# Patient Record
Sex: Male | Born: 1960 | Race: White | Hispanic: No | Smoking: Former smoker
Health system: Southern US, Community
[De-identification: ages and names within clinical notes are randomized; demographics above are authoritative.]

## PROBLEM LIST (undated history)

## (undated) DIAGNOSIS — I7090 Unspecified atherosclerosis: Secondary | ICD-10-CM

## (undated) HISTORY — PX: OTHER SURGICAL HISTORY: SHX169

## (undated) HISTORY — PX: ANKLE FUSION: SHX5718

---

## 1998-06-04 ENCOUNTER — Emergency Department (HOSPITAL_COMMUNITY): Admission: EM | Admit: 1998-06-04 | Discharge: 1998-06-04 | Payer: Self-pay | Admitting: *Deleted

## 1998-06-04 ENCOUNTER — Encounter: Payer: Self-pay | Admitting: Emergency Medicine

## 2007-07-13 ENCOUNTER — Emergency Department (HOSPITAL_COMMUNITY): Admission: EM | Admit: 2007-07-13 | Discharge: 2007-07-13 | Payer: Self-pay | Admitting: Emergency Medicine

## 2010-12-20 LAB — URINALYSIS, ROUTINE W REFLEX MICROSCOPIC
Bilirubin Urine: NEGATIVE
Glucose, UA: 100 — AB
Hgb urine dipstick: NEGATIVE
Ketones, ur: NEGATIVE
Nitrite: NEGATIVE
Protein, ur: NEGATIVE
Specific Gravity, Urine: 1.027
Urobilinogen, UA: 1
pH: 6.5

## 2013-02-28 ENCOUNTER — Other Ambulatory Visit: Payer: Self-pay | Admitting: Physical Medicine and Rehabilitation

## 2013-02-28 DIAGNOSIS — IMO0002 Reserved for concepts with insufficient information to code with codable children: Secondary | ICD-10-CM

## 2013-03-28 ENCOUNTER — Ambulatory Visit
Admission: RE | Admit: 2013-03-28 | Discharge: 2013-03-28 | Disposition: A | Discharge status: Home / Self Care | Payer: BC Managed Care – PPO | Source: Ambulatory Visit | Attending: Physical Medicine and Rehabilitation | Admitting: Physical Medicine and Rehabilitation

## 2013-03-28 DIAGNOSIS — IMO0002 Reserved for concepts with insufficient information to code with codable children: Secondary | ICD-10-CM

## 2013-03-31 ENCOUNTER — Other Ambulatory Visit (HOSPITAL_COMMUNITY): Payer: Self-pay | Admitting: Orthopedic Surgery

## 2013-03-31 DIAGNOSIS — M545 Low back pain, unspecified: Secondary | ICD-10-CM

## 2013-04-03 ENCOUNTER — Other Ambulatory Visit (HOSPITAL_COMMUNITY): Payer: Self-pay | Admitting: Orthopedic Surgery

## 2013-04-03 ENCOUNTER — Ambulatory Visit (HOSPITAL_COMMUNITY)
Admission: RE | Admit: 2013-04-03 | Discharge: 2013-04-03 | Disposition: A | Discharge status: Home / Self Care | Payer: BC Managed Care – PPO | Source: Ambulatory Visit | Attending: Orthopedic Surgery | Admitting: Orthopedic Surgery

## 2013-04-03 DIAGNOSIS — M545 Low back pain, unspecified: Secondary | ICD-10-CM

## 2013-04-03 DIAGNOSIS — M549 Dorsalgia, unspecified: Secondary | ICD-10-CM | POA: Insufficient documentation

## 2013-04-03 MED ORDER — TECHNETIUM TC 99M MEDRONATE IV KIT
25.0000 | PACK | Freq: Once | INTRAVENOUS | Status: AC | PRN
  Administered 2013-04-03: 25 via INTRAVENOUS

## 2013-07-16 ENCOUNTER — Other Ambulatory Visit: Payer: Self-pay | Admitting: Pain Medicine

## 2013-07-16 DIAGNOSIS — M545 Low back pain, unspecified: Secondary | ICD-10-CM

## 2013-07-16 DIAGNOSIS — M546 Pain in thoracic spine: Secondary | ICD-10-CM

## 2013-07-20 ENCOUNTER — Ambulatory Visit
Admission: RE | Admit: 2013-07-20 | Discharge: 2013-07-20 | Disposition: A | Discharge status: Home / Self Care | Payer: BC Managed Care – PPO | Source: Ambulatory Visit | Attending: Pain Medicine | Admitting: Pain Medicine

## 2013-07-20 DIAGNOSIS — M545 Low back pain, unspecified: Secondary | ICD-10-CM

## 2013-07-20 DIAGNOSIS — M546 Pain in thoracic spine: Secondary | ICD-10-CM

## 2013-11-25 ENCOUNTER — Other Ambulatory Visit: Payer: Self-pay | Admitting: Orthopedic Surgery

## 2013-11-25 DIAGNOSIS — M19079 Primary osteoarthritis, unspecified ankle and foot: Secondary | ICD-10-CM

## 2013-11-28 ENCOUNTER — Ambulatory Visit
Admission: RE | Admit: 2013-11-28 | Discharge: 2013-11-28 | Disposition: A | Discharge status: Home / Self Care | Payer: BC Managed Care – PPO | Source: Ambulatory Visit | Attending: Orthopedic Surgery | Admitting: Orthopedic Surgery

## 2013-11-28 DIAGNOSIS — M19079 Primary osteoarthritis, unspecified ankle and foot: Secondary | ICD-10-CM

## 2014-07-10 ENCOUNTER — Other Ambulatory Visit (HOSPITAL_COMMUNITY): Payer: Self-pay | Admitting: *Deleted

## 2014-07-13 ENCOUNTER — Encounter (HOSPITAL_COMMUNITY)
Admission: RE | Admit: 2014-07-13 | Discharge: 2014-07-13 | Disposition: A | Discharge status: Home / Self Care | Payer: BLUE CROSS/BLUE SHIELD | Source: Ambulatory Visit | Attending: Endocrinology | Admitting: Endocrinology

## 2014-07-13 DIAGNOSIS — Z87311 Personal history of (healed) other pathological fracture: Secondary | ICD-10-CM | POA: Diagnosis not present

## 2014-07-13 DIAGNOSIS — M810 Age-related osteoporosis without current pathological fracture: Secondary | ICD-10-CM | POA: Diagnosis present

## 2014-07-13 MED ORDER — DENOSUMAB 60 MG/ML ~~LOC~~ SOLN
60.0000 mg | Freq: Once | SUBCUTANEOUS | Status: AC
  Administered 2014-07-13: 60 mg via SUBCUTANEOUS
  Filled 2014-07-13: qty 1

## 2015-03-26 ENCOUNTER — Emergency Department (HOSPITAL_COMMUNITY): Payer: BLUE CROSS/BLUE SHIELD

## 2015-03-26 ENCOUNTER — Observation Stay (HOSPITAL_COMMUNITY)
Admission: EM | Admit: 2015-03-26 | Discharge: 2015-03-27 | Disposition: A | Discharge status: Home / Self Care | Payer: BLUE CROSS/BLUE SHIELD | Attending: Internal Medicine | Admitting: Internal Medicine

## 2015-03-26 ENCOUNTER — Observation Stay (HOSPITAL_COMMUNITY): Payer: BLUE CROSS/BLUE SHIELD

## 2015-03-26 ENCOUNTER — Encounter (HOSPITAL_COMMUNITY): Payer: Self-pay | Admitting: Emergency Medicine

## 2015-03-26 DIAGNOSIS — R7989 Other specified abnormal findings of blood chemistry: Secondary | ICD-10-CM | POA: Diagnosis not present

## 2015-03-26 DIAGNOSIS — Z8249 Family history of ischemic heart disease and other diseases of the circulatory system: Secondary | ICD-10-CM | POA: Diagnosis not present

## 2015-03-26 DIAGNOSIS — R55 Syncope and collapse: Principal | ICD-10-CM | POA: Diagnosis present

## 2015-03-26 DIAGNOSIS — R778 Other specified abnormalities of plasma proteins: Secondary | ICD-10-CM | POA: Diagnosis present

## 2015-03-26 DIAGNOSIS — F17290 Nicotine dependence, other tobacco product, uncomplicated: Secondary | ICD-10-CM | POA: Diagnosis not present

## 2015-03-26 DIAGNOSIS — R748 Abnormal levels of other serum enzymes: Secondary | ICD-10-CM | POA: Insufficient documentation

## 2015-03-26 DIAGNOSIS — D72829 Elevated white blood cell count, unspecified: Secondary | ICD-10-CM | POA: Insufficient documentation

## 2015-03-26 DIAGNOSIS — I480 Paroxysmal atrial fibrillation: Secondary | ICD-10-CM | POA: Diagnosis present

## 2015-03-26 DIAGNOSIS — R519 Headache, unspecified: Secondary | ICD-10-CM

## 2015-03-26 DIAGNOSIS — I4891 Unspecified atrial fibrillation: Secondary | ICD-10-CM

## 2015-03-26 DIAGNOSIS — R51 Headache: Secondary | ICD-10-CM | POA: Diagnosis not present

## 2015-03-26 HISTORY — DX: Unspecified atherosclerosis: I70.90

## 2015-03-26 LAB — CSF CELL COUNT WITH DIFFERENTIAL
EOS CSF: NONE SEEN % (ref 0–1)
Eosinophils, CSF: NONE SEEN % (ref 0–1)
RBC Count, CSF: 1 /mm3 — ABNORMAL HIGH
RBC Count, CSF: 2 /mm3 — ABNORMAL HIGH
Segmented Neutrophils-CSF: NONE SEEN % (ref 0–6)
Segmented Neutrophils-CSF: NONE SEEN % (ref 0–6)
TUBE #: 1
TUBE #: 4
WBC, CSF: 0 /mm3 (ref 0–5)
WBC, CSF: 2 /mm3 (ref 0–5)

## 2015-03-26 LAB — URINE MICROSCOPIC-ADD ON: RBC / HPF: NONE SEEN RBC/hpf (ref 0–5)

## 2015-03-26 LAB — URINALYSIS, ROUTINE W REFLEX MICROSCOPIC
BILIRUBIN URINE: NEGATIVE
GLUCOSE, UA: NEGATIVE mg/dL
HGB URINE DIPSTICK: NEGATIVE
KETONES UR: 15 mg/dL — AB
LEUKOCYTES UA: NEGATIVE
Nitrite: NEGATIVE
PROTEIN: 30 mg/dL — AB
Specific Gravity, Urine: 1.016 (ref 1.005–1.030)
pH: 6 (ref 5.0–8.0)

## 2015-03-26 LAB — TROPONIN I
Troponin I: 0.17 ng/mL — ABNORMAL HIGH (ref <0.031)
Troponin I: 0.12 ng/mL — ABNORMAL HIGH (ref <0.031)

## 2015-03-26 LAB — COMPREHENSIVE METABOLIC PANEL
ALT: 33 U/L (ref 17–63)
AST: 28 U/L (ref 15–41)
Albumin: 3.9 g/dL (ref 3.5–5.0)
Alkaline Phosphatase: 77 U/L (ref 38–126)
Anion gap: 9 (ref 5–15)
BUN: 8 mg/dL (ref 6–20)
CHLORIDE: 106 mmol/L (ref 101–111)
CO2: 26 mmol/L (ref 22–32)
CREATININE: 0.93 mg/dL (ref 0.61–1.24)
Calcium: 9.4 mg/dL (ref 8.9–10.3)
GFR calc Af Amer: >60 mL/min (ref >60)
Glucose, Bld: 143 mg/dL — ABNORMAL HIGH (ref 65–99)
Potassium: 3.8 mmol/L (ref 3.5–5.1)
Sodium: 141 mmol/L (ref 135–145)
Total Bilirubin: 0.9 mg/dL (ref 0.3–1.2)
Total Protein: 7.2 g/dL (ref 6.5–8.1)

## 2015-03-26 LAB — I-STAT CHEM 8, ED
BUN: 11 mg/dL (ref 6–20)
CHLORIDE: 104 mmol/L (ref 101–111)
CREATININE: 0.8 mg/dL (ref 0.61–1.24)
Calcium, Ion: 1.16 mmol/L (ref 1.12–1.23)
GLUCOSE: 136 mg/dL — AB (ref 65–99)
HCT: 46 % (ref 39.0–52.0)
Hemoglobin: 15.6 g/dL (ref 13.0–17.0)
POTASSIUM: 3.7 mmol/L (ref 3.5–5.1)
Sodium: 144 mmol/L (ref 135–145)
TCO2: 25 mmol/L (ref 0–100)

## 2015-03-26 LAB — I-STAT TROPONIN, ED: TROPONIN I, POC: 0.09 ng/mL — AB (ref 0.00–0.08)

## 2015-03-26 LAB — CBC
HEMATOCRIT: 42.4 % (ref 39.0–52.0)
HEMOGLOBIN: 14.5 g/dL (ref 13.0–17.0)
MCH: 32.7 pg (ref 26.0–34.0)
MCHC: 34.2 g/dL (ref 30.0–36.0)
MCV: 95.5 fL (ref 78.0–100.0)
Platelets: 285 10*3/uL (ref 150–400)
RBC: 4.44 MIL/uL (ref 4.22–5.81)
RDW: 13 % (ref 11.5–15.5)
WBC: 22.8 10*3/uL — AB (ref 4.0–10.5)

## 2015-03-26 LAB — PROTIME-INR
INR: 1.17 (ref 0.00–1.49)
Prothrombin Time: 15 seconds (ref 11.6–15.2)

## 2015-03-26 LAB — DIFFERENTIAL
BASOS ABS: 0 10*3/uL (ref 0.0–0.1)
BASOS PCT: 0 %
Eosinophils Absolute: 0.1 10*3/uL (ref 0.0–0.7)
Eosinophils Relative: 0 %
LYMPHS ABS: 1.7 10*3/uL (ref 0.7–4.0)
Lymphocytes Relative: 7 %
MONOS PCT: 5 %
Monocytes Absolute: 1.2 10*3/uL — ABNORMAL HIGH (ref 0.1–1.0)
NEUTROS ABS: 19.9 10*3/uL — AB (ref 1.7–7.7)
Neutrophils Relative %: 88 %

## 2015-03-26 LAB — TSH: TSH: 0.774 u[IU]/mL (ref 0.350–4.500)

## 2015-03-26 LAB — RAPID URINE DRUG SCREEN, HOSP PERFORMED
AMPHETAMINES: NOT DETECTED
BARBITURATES: NOT DETECTED
BENZODIAZEPINES: NOT DETECTED
COCAINE: NOT DETECTED
Opiates: NOT DETECTED
TETRAHYDROCANNABINOL: POSITIVE — AB

## 2015-03-26 LAB — D-DIMER, QUANTITATIVE: D-Dimer, Quant: 0.84 ug/mL-FEU — ABNORMAL HIGH (ref 0.00–0.50)

## 2015-03-26 LAB — PROTEIN AND GLUCOSE, CSF
Glucose, CSF: 80 mg/dL — ABNORMAL HIGH (ref 40–70)
TOTAL PROTEIN, CSF: 58 mg/dL — AB (ref 15–45)

## 2015-03-26 LAB — APTT: APTT: 26 s (ref 24–37)

## 2015-03-26 LAB — ETHANOL: Alcohol, Ethyl (B): <5 mg/dL (ref <5)

## 2015-03-26 MED ORDER — SODIUM CHLORIDE 0.9 % IV BOLUS (SEPSIS): 500.0000 mL | Freq: Once | INTRAVENOUS | Status: DC

## 2015-03-26 MED ORDER — SODIUM CHLORIDE 0.9 % IV SOLN: INTRAVENOUS | Status: DC

## 2015-03-26 MED ORDER — SODIUM CHLORIDE 0.9 % IV SOLN
Freq: Once | INTRAVENOUS | Status: AC
  Administered 2015-03-26: 17:00:00 via INTRAVENOUS

## 2015-03-26 MED ORDER — LIDOCAINE HCL 1 % IJ SOLN
15.0000 mL | Freq: Once | INTRAMUSCULAR | Status: AC
  Administered 2015-03-26: 15 mL

## 2015-03-26 MED ORDER — IOHEXOL 350 MG/ML SOLN
80.0000 mL | Freq: Once | INTRAVENOUS | Status: AC | PRN
  Administered 2015-03-26: 80 mL via INTRAVENOUS

## 2015-03-26 MED ORDER — SODIUM CHLORIDE 0.9 % IV BOLUS (SEPSIS)
1000.0000 mL | Freq: Once | INTRAVENOUS | Status: AC
  Administered 2015-03-26: 1000 mL via INTRAVENOUS

## 2015-03-26 MED ORDER — SODIUM CHLORIDE 0.9 % IV SOLN
INTRAVENOUS | Status: DC
  Administered 2015-03-26 – 2015-03-27 (×2): via INTRAVENOUS

## 2015-03-26 MED ORDER — ACETAMINOPHEN 325 MG PO TABS
650.0000 mg | ORAL_TABLET | Freq: Four times a day (QID) | ORAL | Status: DC | PRN
  Administered 2015-03-26 – 2015-03-27 (×2): 650 mg via ORAL
  Filled 2015-03-26 (×2): qty 2

## 2015-03-26 MED ORDER — SODIUM CHLORIDE 0.9 % IJ SOLN: 3.0000 mL | Freq: Two times a day (BID) | INTRAMUSCULAR | Status: DC

## 2015-03-26 MED ORDER — ASPIRIN 81 MG PO CHEW
324.0000 mg | CHEWABLE_TABLET | Freq: Once | ORAL | Status: AC
Start: 2015-03-26 — End: 2015-03-26
  Administered 2015-03-26: 324 mg via ORAL
  Filled 2015-03-26: qty 4

## 2015-03-26 NOTE — ED Provider Notes (Signed)
CSN: [id number]     Arrival date & time 03/26/15  1503 History   First MD Initiated Contact with Patient 03/26/15 1515     Chief Complaint  Patient presents with  . Loss of Consciousness  . Fall     (Consider location/radiation/quality/duration/timing/severity/associated sxs/prior Treatment) HPI Comments: Patient states he was working in a workshop when he had a sudden onset left-sided headache associated with nausea, dizziness and diaphoresis. He then had a syncopal episode where he got to a chair but then fell to the ground and woke up on the floor. He tried to get up but then passed out again striking the bridge of his nose on an object. He denies any chest pain or shortness of breath. He had one episode of vomiting for EMS. Patient reports no medical history and does not take any medications. He has a history of osteoporosis and arthritis. He states he did not have anything to eat today only coffee. Blood sugar was 207 for EMS. Monitor shows intermittent atrial fibrillation, bigeminy, SVT. No tongue biting or incontinence. Patient with no history of headache. No focal weakness, numbness or tingling.  Patient is a 54 y.o. male presenting with fall. The history is provided by the patient and the EMS personnel. The history is limited by the condition of the patient.  Fall Associated symptoms include headaches. Pertinent negatives include no shortness of breath.    Past Medical History  Diagnosis Date  . Atherosclerosis    Past Surgical History  Procedure Laterality Date  . Ankle fusion Right     titanium Plate   Family History  Problem Relation Age of Onset  . Coronary artery disease Father 7164    CABG   Social History  Substance Use Topics  . Smoking status: Current Some Day Smoker    Types: Cigars  . Smokeless tobacco: None  . Alcohol Use: 0.0 oz/week    0 Standard drinks or equivalent per week    Review of Systems  Constitutional: Negative for fever, activity change and  appetite change.  HENT: Negative for congestion.   Eyes: Negative for visual disturbance.  Respiratory: Negative for cough, chest tightness and shortness of breath.   Cardiovascular: Positive for syncope.  Gastrointestinal: Positive for nausea and vomiting.  Genitourinary: Negative for urgency and testicular pain.  Musculoskeletal: Positive for neck pain. Negative for myalgias and arthralgias.  Skin: Negative for wound.  Neurological: Positive for dizziness, light-headedness and headaches.  A complete 10 system review of systems was obtained and all systems are negative except as noted in the HPI and PMH.      Allergies  Codeine  Home Medications   Prior to Admission medications   Medication Sig Start Date End Date Taking? Authorizing Provider  Aspirin-Acetaminophen (GOODY BODY PAIN) 500-325 MG PACK Take 1 Package by mouth daily as needed (pain).   Yes Historical Provider, MD  aspirin-acetaminophen-caffeine (EXCEDRIN MIGRAINE) [phone] MG tablet Take 1 tablet by mouth every 6 (six) hours as needed for headache.   Yes Historical Provider, MD   BP 103/65 mmHg  Pulse 63  Temp(Src) 98 F (36.7 C) (Oral)  Resp 15  SpO2 97% Physical Exam  Constitutional: He is oriented to person, place, and time. He appears well-developed and well-nourished. No distress.  HENT:  Head: Normocephalic and atraumatic.  Mouth/Throat: Oropharynx is clear and moist. No oropharyngeal exudate.  Abrasion to bridge of nose, no septal hematoma or hemotympanum  Eyes: Conjunctivae and EOM are normal. Pupils are equal, round, and  reactive to light.  Neck: Normal range of motion. Neck supple.  Diffuse paraspinal C-spine tenderness  Cardiovascular: Normal rate, normal heart sounds and intact distal pulses.   No murmur heard. Irregular tachycardia, intermittent atrial fibrillation, bigeminy, SVT  Pulmonary/Chest: Effort normal and breath sounds normal. No respiratory distress.  Abdominal: Soft. There is no  tenderness. There is no rebound and no guarding.  Musculoskeletal: Normal range of motion. He exhibits no edema or tenderness.  Neurological: He is alert and oriented to person, place, and time. No cranial nerve deficit. He exhibits normal muscle tone. Coordination normal.  No ataxia on finger to nose bilaterally. No pronator drift. 5/5 strength throughout. CN 2-12 intact.Equal grip strength. Sensation intact.   Skin: Skin is warm.  Psychiatric: He has a normal mood and affect. His behavior is normal.  Nursing note and vitals reviewed.   ED Course  .Lumbar Puncture Date/Time: 03/26/2015 5:34 PM Performed by: Glynn Octave Authorized by: Glynn Octave Consent: Verbal consent obtained. Written consent obtained. Risks and benefits: risks, benefits and alternatives were discussed Consent given by: patient Patient understanding: patient states understanding of the procedure being performed Patient consent: the patient's understanding of the procedure matches consent given Procedure consent: procedure consent matches procedure scheduled Relevant documents: relevant documents present and verified Test results: test results available and properly labeled Site marked: the operative site was marked Imaging studies: imaging studies available Patient identity confirmed: provided demographic data and verbally with patient Time out: Immediately prior to procedure a "time out" was called to verify the correct patient, procedure, equipment, support staff and site/side marked as required. Indications: evaluation for subarachnoid hemorrhage Anesthesia: local infiltration Local anesthetic: lidocaine 1% without epinephrine Anesthetic total: 4 ml Patient sedated: no Preparation: Patient was prepped and draped in the usual sterile fashion. Lumbar space: L4-L5 interspace Patient's position: sitting Needle gauge: 20 Needle type: spinal needle - Quincke tip Needle length: 2.5 in Number of attempts:  2 Fluid appearance: clear Tubes of fluid: 4 Total volume: 4 ml Post-procedure: site cleaned and adhesive bandage applied Patient tolerance: Patient tolerated the procedure well with no immediate complications   (including critical care time) Labs Review Labs Reviewed  CBC - Abnormal; Notable for the following:    WBC 22.8 (*)    All other components within normal limits  DIFFERENTIAL - Abnormal; Notable for the following:    Neutro Abs 19.9 (*)    Monocytes Absolute 1.2 (*)    All other components within normal limits  COMPREHENSIVE METABOLIC PANEL - Abnormal; Notable for the following:    Glucose, Bld 143 (*)    All other components within normal limits  URINE RAPID DRUG SCREEN, HOSP PERFORMED - Abnormal; Notable for the following:    Tetrahydrocannabinol POSITIVE (*)    All other components within normal limits  URINALYSIS, ROUTINE W REFLEX MICROSCOPIC (NOT AT [facility]) - Abnormal; Notable for the following:    APPearance CLOUDY (*)    Ketones, ur 15 (*)    Protein, ur 30 (*)    All other components within normal limits  D-DIMER, QUANTITATIVE (NOT AT [facility]) - Abnormal; Notable for the following:    D-Dimer, Quant 0.84 (*)    All other components within normal limits  URINE MICROSCOPIC-ADD ON - Abnormal; Notable for the following:    Squamous Epithelial / LPF 0-5 (*)    Bacteria, UA RARE (*)    Casts GRANULAR CAST (*)    All other components within normal limits  I-STAT CHEM 8, ED -  Abnormal; Notable for the following:    Glucose, Bld 136 (*)    All other components within normal limits  I-STAT TROPOININ, ED - Abnormal; Notable for the following:    Troponin i, poc 0.09 (*)    All other components within normal limits  CSF CULTURE  ETHANOL  PROTIME-INR  APTT  CSF CELL COUNT WITH DIFFERENTIAL  CSF CELL COUNT WITH DIFFERENTIAL  PROTEIN AND GLUCOSE, CSF  TROPONIN I  TROPONIN I  TROPONIN I  TSH    Imaging Review Ct Head Wo Contrast  03/26/2015  CLINICAL DATA:   54 year old male with acute syncope today and left-sided headache and cervical spine pain. EXAM: CT HEAD WITHOUT CONTRAST CT CERVICAL SPINE WITHOUT CONTRAST TECHNIQUE: Multidetector CT imaging of the head and cervical spine was performed following the standard protocol without intravenous contrast. Multiplanar CT image reconstructions of the cervical spine were also generated. COMPARISON:  None. FINDINGS: CT HEAD FINDINGS No intracranial abnormalities are identified, including mass lesion or mass effect, hydrocephalus, extra-axial fluid collection, midline shift, hemorrhage, or acute infarction. The visualized bony calvarium is unremarkable. CT CERVICAL SPINE FINDINGS Normal alignment is noted. There is no evidence of acute fracture, subluxation or prevertebral soft tissue swelling. Mild -moderate degenerative disc disease and spondylosis at C5-6 and C6-7 noted. No focal bony lesions are identified. No soft tissue abnormalities are identified. Paraseptal emphysema in the lung apices noted. IMPRESSION: Unremarkable noncontrast head CT. No static evidence of acute injury to the cervical spine. Mild-moderate degenerative changes from C5-C7. Electronically Signed   By: Harmon Pier M.D.   On: 03/26/2015 16:00   Ct Cervical Spine Wo Contrast  03/26/2015  CLINICAL DATA:  54 year old male with acute syncope today and left-sided headache and cervical spine pain. EXAM: CT HEAD WITHOUT CONTRAST CT CERVICAL SPINE WITHOUT CONTRAST TECHNIQUE: Multidetector CT imaging of the head and cervical spine was performed following the standard protocol without intravenous contrast. Multiplanar CT image reconstructions of the cervical spine were also generated. COMPARISON:  None. FINDINGS: CT HEAD FINDINGS No intracranial abnormalities are identified, including mass lesion or mass effect, hydrocephalus, extra-axial fluid collection, midline shift, hemorrhage, or acute infarction. The visualized bony calvarium is unremarkable. CT  CERVICAL SPINE FINDINGS Normal alignment is noted. There is no evidence of acute fracture, subluxation or prevertebral soft tissue swelling. Mild -moderate degenerative disc disease and spondylosis at C5-6 and C6-7 noted. No focal bony lesions are identified. No soft tissue abnormalities are identified. Paraseptal emphysema in the lung apices noted. IMPRESSION: Unremarkable noncontrast head CT. No static evidence of acute injury to the cervical spine. Mild-moderate degenerative changes from C5-C7. Electronically Signed   By: Harmon Pier M.D.   On: 03/26/2015 16:00   Dg Chest Portable 1 View  03/26/2015  CLINICAL DATA:  Multiple syncopal episodes today.  Fell.  Vomiting. EXAM: PORTABLE CHEST 1 VIEW COMPARISON:  None. FINDINGS: Normal sized heart. Clear lungs. Diffuse peribronchial thickening. Old, healed right clavicle fracture. IMPRESSION: No acute abnormality.  Mild to moderate chronic bronchitic changes. Electronically Signed   By: Beckie Salts M.D.   On: 03/26/2015 16:08   I have personally reviewed and evaluated these images and lab results as part of my medical decision-making.   EKG Interpretation   Date/Time:  Friday March 26 2015 16:38:57 EST Ventricular Rate:  70 PR Interval:  147 QRS Duration: 116 QT Interval:  389 QTC Calculation: 420 R Axis:   91 Text Interpretation:  Sinus rhythm Incomplete right bundle branch block ST  elev, probable normal  early repol pattern now sinus Confirmed by Manus Gunning   MD, Pearlie Lafosse [phone]) on 03/26/2015 4:44:39 PM      MDM   Final diagnoses:  Syncope  Syncope  Syncope   Sudden onset headache with syncope, diaphoresis and vomiting. Irregular heart rhythms on monitor. High concern for subarachnoid hemorrhage.  Patient has no focal neurological deficits. CT head is negative for subarachnoid hemorrhage. CT C-spine is negative. Episode of A fib, then bigeminy, then SVT.  Improved with vagal maneuver.  Troponin positive for a 0.09. He denies any  chest pain or shortness of breath. Heart rhythm has seems to normalize to normal sinus rhythm. ASA given.  Case discussed with cardiology who will evaluate patient. Discussed with neurology Dr. Roseanne Reno as well. He agrees patient likely needs a lumbar puncture to exclude subarachnoid hemorrhage and ruptured aneurysm. However if he requires IV heparin or cardiac catheterization he will not be able to have this procedure. He recommends not performing lumbar puncture at this time until cardiology has evaluated patient.  Labs show positive troponin, leukocytosis. Drug screen positive for marijuana. Cardiology does not anticipate any invasive procedures.  They request D-dimer and medicine admit.  CT scan is negative for intracranial hemorrhage. Lumbar puncture performed as above.  D-dimer is slightly positive. Patient with no hypoxia or tachycardia. No chest pain or shortness of breath. Doubt pulmonary embolism but will check CT angiogram  CTA negative for PE> LP results pending at time of admission. D/w Dr. Elvera Lennox.  CRITICAL CARE Performed by: Glynn Octave Total critical care time: 35 minutes Critical care time was exclusive of separately billable procedures and treating other patients. Critical care was necessary to treat or prevent imminent or life-threatening deterioration. Critical care was time spent personally by me on the following activities: development of treatment plan with patient and/or surrogate as well as nursing, discussions with consultants, evaluation of patient's response to treatment, examination of patient, obtaining history from patient or surrogate, ordering and performing treatments and interventions, ordering and review of laboratory studies, ordering and review of radiographic studies, pulse oximetry and re-evaluation of patient's condition.   Glynn Octave, MD 03/26/15 [phone]

## 2015-03-26 NOTE — H&P (Addendum)
History and Physical   Wesley Barron ZOX:[id number] DOB: [birth year] DOA: 03/26/2015  Referring physician: Dr. Manus Gunning PCP: Julian Hy, MD  Specialists: cardiology, neurology  Chief Complaint: neurology   HPI: Wesley Barron is a 54 y.o. male has no past medical history, presents to the emergency room with a chief complaint of a syncopal episode. Patient was in his shop working, he was a cigar, when he had a syncopal episode and passed out. He also endorses a headache with nausea without. He got up and then he had another syncopal episode and he passed out again and hit his nose. EMS was called, and had an episode of emesis when EMS got there. He was brought to the emergency room where he went briefly to A. fib with RVR and an SVT with a heart rate of 140, he performed a vagal maneuver per EDP and he returned to sinus rhythm. He denies any chest pain or palpitations. He reports intermittent episodes of palpitations throughout the years, had one episode in the last year alone. In the emergency room, his blood work is pertinent for a white count of 22K, mild troponin elevation of 0.09 and a d-dimer elevation. Cardiology and neurology were consulted, there was initial concern for subarachnoid hemorrhage and patient underwent CT scan of the brain without acute findings and also on LP. LP results are pending. TRH was asked for admission.   Review of Systems: as per HPI otherwise negative   Past Medical History  Diagnosis Date  . Atherosclerosis    Past Surgical History  Procedure Laterality Date  . Ankle fusion Right     titanium Plate   Social History:  reports that he has been smoking Cigars.  He does not have any smokeless tobacco history on file. He reports that he drinks alcohol. His drug history is not on file.  Allergies  Allergen Reactions  . Codeine Other (See Comments)    headache    Family History  Problem Relation Age of Onset  . Coronary artery disease Father 59   CABG   Prior to Admission medications   Medication Sig Start Date End Date Taking? Authorizing Provider  Aspirin-Acetaminophen (GOODY BODY PAIN) 500-325 MG PACK Take 1 Package by mouth daily as needed (pain).   Yes Historical Provider, MD  aspirin-acetaminophen-caffeine (EXCEDRIN MIGRAINE) [phone] MG tablet Take 1 tablet by mouth every 6 (six) hours as needed for headache.   Yes Historical Provider, MD   Physical Exam: Filed Vitals:   03/26/15 1615 03/26/15 1630 03/26/15 1645 03/26/15 1715  BP: 115/69 102/73 120/70 103/65  Pulse: 70 69 68 63  Temp:      TempSrc:      Resp: SpO2: 96% 97% 95% 97%    GENERAL: NAD  HEENT: head NCAT, no scleral icterus. Pupils round and reactive.  NECK: Supple. No carotid bruits.   LUNGS: Clear to auscultation. No wheezing or crackles  HEART: Regular rate and rhythm without murmur. 2+ pulses, no JVD, no peripheral edema  ABDOMEN: Soft, nontender, and nondistended. Positive bowel sounds. No hepatosplenomegaly was noted.  EXTREMITIES: Without any cyanosis, clubbing, rash, lesions or edema.  NEUROLOGIC: Alert and oriented x3. Cranial nerves II through XII are grossly intact. Strength 5/5 in all 4.   Labs on Admission:  Basic Metabolic Panel:  Recent Labs Lab 03/26/15 1531 03/26/15 1544  NA 141 144  K 3.8 3.7  CL 106 104  CO2 26  --   GLUCOSE 143*  136*  BUN 8 11  CREATININE 0.93 0.80  CALCIUM 9.4  --    Liver Function Tests:  Recent Labs Lab 03/26/15 1531  AST 28  ALT 33  ALKPHOS 77  BILITOT 0.9  PROT 7.2  ALBUMIN 3.9   CBC:  Recent Labs Lab 03/26/15 1531 03/26/15 1544  WBC 22.8*  --   NEUTROABS 19.9*  --   HGB 14.5 15.6  HCT 42.4 46.0  MCV 95.5  --   PLT 285  --    Radiological Exams on Admission: Ct Head Wo Contrast  03/26/2015  CLINICAL DATA:  54 year old male with acute syncope today and left-sided headache and cervical spine pain. EXAM: CT HEAD WITHOUT CONTRAST CT CERVICAL SPINE WITHOUT  CONTRAST TECHNIQUE: Multidetector CT imaging of the head and cervical spine was performed following the standard protocol without intravenous contrast. Multiplanar CT image reconstructions of the cervical spine were also generated. COMPARISON:  None. FINDINGS: CT HEAD FINDINGS No intracranial abnormalities are identified, including mass lesion or mass effect, hydrocephalus, extra-axial fluid collection, midline shift, hemorrhage, or acute infarction. The visualized bony calvarium is unremarkable. CT CERVICAL SPINE FINDINGS Normal alignment is noted. There is no evidence of acute fracture, subluxation or prevertebral soft tissue swelling. Mild -moderate degenerative disc disease and spondylosis at C5-6 and C6-7 noted. No focal bony lesions are identified. No soft tissue abnormalities are identified. Paraseptal emphysema in the lung apices noted. IMPRESSION: Unremarkable noncontrast head CT. No static evidence of acute injury to the cervical spine. Mild-moderate degenerative changes from C5-C7. Electronically Signed   By: Harmon PierJeffrey  Hu M.D.   On: 03/26/2015 16:00   Ct Cervical Spine Wo Contrast  03/26/2015  CLINICAL DATA:  54 year old male with acute syncope today and left-sided headache and cervical spine pain. EXAM: CT HEAD WITHOUT CONTRAST CT CERVICAL SPINE WITHOUT CONTRAST TECHNIQUE: Multidetector CT imaging of the head and cervical spine was performed following the standard protocol without intravenous contrast. Multiplanar CT image reconstructions of the cervical spine were also generated. COMPARISON:  None. FINDINGS: CT HEAD FINDINGS No intracranial abnormalities are identified, including mass lesion or mass effect, hydrocephalus, extra-axial fluid collection, midline shift, hemorrhage, or acute infarction. The visualized bony calvarium is unremarkable. CT CERVICAL SPINE FINDINGS Normal alignment is noted. There is no evidence of acute fracture, subluxation or prevertebral soft tissue swelling. Mild -moderate  degenerative disc disease and spondylosis at C5-6 and C6-7 noted. No focal bony lesions are identified. No soft tissue abnormalities are identified. Paraseptal emphysema in the lung apices noted. IMPRESSION: Unremarkable noncontrast head CT. No static evidence of acute injury to the cervical spine. Mild-moderate degenerative changes from C5-C7. Electronically Signed   By: Harmon PierJeffrey  Hu M.D.   On: 03/26/2015 16:00   Dg Chest Portable 1 View  03/26/2015  CLINICAL DATA:  Multiple syncopal episodes today.  Fell.  Vomiting. EXAM: PORTABLE CHEST 1 VIEW COMPARISON:  None. FINDINGS: Normal sized heart. Clear lungs. Diffuse peribronchial thickening. Old, healed right clavicle fracture. IMPRESSION: No acute abnormality.  Mild to moderate chronic bronchitic changes. Electronically Signed   By: Beckie SaltsSteven  Reid M.D.   On: 03/26/2015 16:08    EKG: Independently reviewed. currently in sinus rhythm   Assessment/Plan Principal Problem:   Syncope and collapse Active Problems:   PAF (paroxysmal atrial fibrillation) (HCC)   Family history of coronary artery disease in father   Elevated troponin   Syncope   Syncopal episodes - broad differential, patient did have significant arrhythmia on admission, will admit to telemetry and monitored overnight  -  cardiology consulted, appreciate input  - Obtain a 2-D echo  - given elevated troponin we'll cycle cardiac enzymes overnight, however this is likely demand. He denies chest pain.  - Elevated d-dimer, CT angiogram of the chest is pending by EDP  Leukocytosis  - Likely reactive, he is afebrile, however the headache and the syncopal episode and LP has been done  - Results are pending, will follow up  - No apparent sources of infection  Paroxysmal A. fib  - CHADS VASC score is 0 - 2D echo   Diet: regular Fluids: NS DVT Prophylaxis: SCD  Code Status: Full  Family Communication: no family bedside  Disposition Plan: admit to tele    Schelly Chuba M. Elvera Lennox,  MD [facility] Pager [phone]  If 7PM-7AM, please contact night-coverage [email or web address] Password [email or web address] 03/26/2015, 5:58 PM

## 2015-03-26 NOTE — ED Notes (Addendum)
Per [facility], pt was outside working, had multiple syncopal episodes, sitting in the chair, passed out, woke up again stood up and passed out again. Pt hit bridge of nose, unsure on what. Pt not on any blood thinners. No medical hx. AAox4. Pt actively throwing up upon EMS arrival. Given 500ccs. CBG 207. BP 90 palpated, after fluid bolus 105/68. Denies pain at this time.

## 2015-03-26 NOTE — Progress Notes (Signed)
Patient arrived in the unit via bed accompanied by NT. Patient' stable. Orientation given to the unit. Patient verbalizes understanding.

## 2015-03-26 NOTE — ED Notes (Signed)
Pt transproted to CT with this nurse with no adverse events.

## 2015-03-26 NOTE — Consult Note (Signed)
Reason for Consult:   Syncope  Requesting Physician: ED Primary Cardiologist New  HPI:    54 y/o single male, one son 39 y/o, admitted via EMS to [facility] ED today after recurrent syncope and collapse. 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 He says he got up and collapsed again hitting his nose. He was transported to the ED by EMS. In the ED he was in AF with VR 110. He then had some ventricular ectopy and PSVT at 140. The ED MD had him perform a vagal maneuver and the pt converted to NSR. He denies any chest pain, hemoptysis, or SOB. He admits that he has noticed occasional palpitations at rest in the past. He no other significant medical issues and has never had prior cardiac evaluation.    PMHx:  Past Medical History  Diagnosis Date  . Atherosclerosis     Past Surgical History  Procedure Laterality Date  . Ankle fusion Right     titanium Plate    SOCHx:  reports that he has been smoking Cigars.  He does not have any smokeless tobacco history on file. He reports that he drinks alcohol. His drug history is not on file.  FAMHx: Family History  Problem Relation Age of Onset  . Coronary artery disease Father 1    CABG    ALLERGIES: Allergies  Allergen Reactions  . Codeine Other (See Comments)    headache    ROS: Review of Systems: Pt tells me he has been "laid up" with a bad back since October and just now starting to be up and around General: negative for chills, fever, night sweats or weight changes.  Cardiovascular: negative for chest pain, dyspnea on exertion, edema, orthopnea, palpitations, paroxysmal nocturnal dyspnea or shortness of breath HEENT: negative for any visual disturbances, blindness, glaucoma Dermatological: negative for rash Respiratory: negative for cough, hemoptysis, or wheezing Urologic: negative for hematuria or  dysuria Abdominal: negative for nausea, vomiting, diarrhea, bright red blood per rectum, melena, or hematemesis Neurologic: negative for visual changes, syncope, or dizziness Musculoskeletal: negative for back pain, joint pain, or swelling Psych: cooperative and appropriate All other systems reviewed and are otherwise negative except as noted above.   HOME MEDICATIONS: Prior to Admission medications   Medication Sig Start Date End Date Taking? Authorizing Provider  Aspirin-Acetaminophen (GOODY BODY PAIN) 500-325 MG PACK Take 1 Package by mouth daily as needed (pain).   Yes Historical Provider, MD  aspirin-acetaminophen-caffeine (EXCEDRIN MIGRAINE) 7243930520 MG tablet Take 1 tablet by mouth every 6 (six) hours as needed for headache.   Yes Historical Provider, MD    HOSPITAL MEDICATIONS: I have reviewed the patient's current medications.  VITALS: Blood pressure 110/67, pulse 67, temperature 98 F (36.7 C), temperature source Oral, resp. rate 12, SpO2 94 %.  PHYSICAL EXAM: General appearance: alert, cooperative, no distress and small laceration across the bridge of his nose Neck: no carotid bruit and no JVD Lungs: scattered rhonchi Heart: regular rate and rhythm Abdomen: soft, non-tender; bowel sounds normal; no masses,  no organomegaly Extremities: extremities normal, atraumatic, no cyanosis or edema Pulses: 2+ and symmetric Skin: Skin color, texture, turgor normal. No rashes or lesions Neurologic: Grossly normal  LABS: Results for orders placed or performed during the hospital encounter of 03/26/15 (from the past 24 hour(s))  Ethanol  Status: None   Collection Time: 03/26/15  3:31 PM  Result Value Ref Range   Alcohol, Ethyl (B) <5 <5 mg/dL  Protime-INR     Status: None   Collection Time: 03/26/15  3:31 PM  Result Value Ref Range   Prothrombin Time 15.0 11.6 - 15.2 seconds   INR 1.17 0.00 - 1.49  APTT     Status: None   Collection Time: 03/26/15  3:31 PM  Result Value  Ref Range   aPTT 26 24 - 37 seconds  CBC     Status: Abnormal   Collection Time: 03/26/15  3:31 PM  Result Value Ref Range   WBC 22.8 (H) 4.0 - 10.5 K/uL   RBC 4.44 4.22 - 5.81 MIL/uL   Hemoglobin 14.5 13.0 - 17.0 g/dL   HCT 69.6 29.5 - 28.4 %   MCV 95.5 78.0 - 100.0 fL   MCH 32.7 26.0 - 34.0 pg   MCHC 34.2 30.0 - 36.0 g/dL   RDW 13.2 44.0 - 10.2 %   Platelets 285 150 - 400 K/uL  Differential     Status: Abnormal   Collection Time: 03/26/15  3:31 PM  Result Value Ref Range   Neutrophils Relative % 88 %   Neutro Abs 19.9 (H) 1.7 - 7.7 K/uL   Lymphocytes Relative 7 %   Lymphs Abs 1.7 0.7 - 4.0 K/uL   Monocytes Relative 5 %   Monocytes Absolute 1.2 (H) 0.1 - 1.0 K/uL   Eosinophils Relative 0 %   Eosinophils Absolute 0.1 0.0 - 0.7 K/uL   Basophils Relative 0 %   Basophils Absolute 0.0 0.0 - 0.1 K/uL  Comprehensive metabolic panel     Status: Abnormal   Collection Time: 03/26/15  3:31 PM  Result Value Ref Range   Sodium 141 135 - 145 mmol/L   Potassium 3.8 3.5 - 5.1 mmol/L   Chloride 106 101 - 111 mmol/L   CO2 26 22 - 32 mmol/L   Glucose, Bld 143 (H) 65 - 99 mg/dL   BUN 8 6 - 20 mg/dL   Creatinine, Ser 7.25 0.61 - 1.24 mg/dL   Calcium 9.4 8.9 - 36.6 mg/dL   Total Protein 7.2 6.5 - 8.1 g/dL   Albumin 3.9 3.5 - 5.0 g/dL   AST 28 15 - 41 U/L   ALT 33 17 - 63 U/L   Alkaline Phosphatase 77 38 - 126 U/L   Total Bilirubin 0.9 0.3 - 1.2 mg/dL   GFR calc non Af Amer >60 >60 mL/min   GFR calc Af Amer >60 >60 mL/min   Anion gap 9 5 - 15  I-stat troponin, ED (not at [place], [facility])     Status: Abnormal   Collection Time: 03/26/15  3:38 PM  Result Value Ref Range   Troponin i, poc 0.09 (HH) 0.00 - 0.08 ng/mL   Comment NOTIFIED PHYSICIAN    Comment 3          I-Stat Chem 8, ED  (not at [facility], [facility])     Status: Abnormal   Collection Time: 03/26/15  3:44 PM  Result Value Ref Range   Sodium 144 135 - 145 mmol/L   Potassium 3.7 3.5 - 5.1 mmol/L   Chloride 104 101 - 111 mmol/L   BUN 11 6  - 20 mg/dL   Creatinine, Ser 4.40 0.61 - 1.24 mg/dL   Glucose, Bld 347 (H) 65 - 99 mg/dL   Calcium, Ion 4.25 9.56 - 1.23 mmol/L   TCO2  25 0 - 100 mmol/L   Hemoglobin 15.6 13.0 - 17.0 g/dL   HCT 16.1 09.6 - 04.5 %  Urinalysis, Routine w reflex microscopic (not at [facility])     Status: Abnormal   Collection Time: 03/26/15  4:10 PM  Result Value Ref Range   Color, Urine YELLOW YELLOW   APPearance CLOUDY (A) CLEAR   Specific Gravity, Urine 1.016 1.005 - 1.030   pH 6.0 5.0 - 8.0   Glucose, UA NEGATIVE NEGATIVE mg/dL   Hgb urine dipstick NEGATIVE NEGATIVE   Bilirubin Urine NEGATIVE NEGATIVE   Ketones, ur 15 (A) NEGATIVE mg/dL   Protein, ur 30 (A) NEGATIVE mg/dL   Nitrite NEGATIVE NEGATIVE   Leukocytes, UA NEGATIVE NEGATIVE  Urine microscopic-add on     Status: Abnormal   Collection Time: 03/26/15  4:10 PM  Result Value Ref Range   Squamous Epithelial / LPF 0-5 (A) NONE SEEN   WBC, UA 0-5 0 - 5 WBC/hpf   RBC / HPF NONE SEEN 0 - 5 RBC/hpf   Bacteria, UA RARE (A) NONE SEEN   Casts GRANULAR CAST (A) NEGATIVE    EKG: NSR, RBBB  IMAGING: Ct Head Wo Contrast  03/26/2015  CLINICAL DATA:  54 year old male with acute syncope today and left-sided headache and cervical spine pain. EXAM: CT HEAD WITHOUT CONTRAST CT CERVICAL SPINE WITHOUT CONTRAST TECHNIQUE: Multidetector CT imaging of the head and cervical spine was performed following the standard protocol without intravenous contrast. Multiplanar CT image reconstructions of the cervical spine were also generated. COMPARISON:  None. FINDINGS: CT HEAD FINDINGS No intracranial abnormalities are identified, including mass lesion or mass effect, hydrocephalus, extra-axial fluid collection, midline shift, hemorrhage, or acute infarction. The visualized bony calvarium is unremarkable. CT CERVICAL SPINE FINDINGS Normal alignment is noted. There is no evidence of acute fracture, subluxation or prevertebral soft tissue swelling. Mild -moderate degenerative disc  disease and spondylosis at C5-6 and C6-7 noted. No focal bony lesions are identified. No soft tissue abnormalities are identified. Paraseptal emphysema in the lung apices noted. IMPRESSION: Unremarkable noncontrast head CT. No static evidence of acute injury to the cervical spine. Mild-moderate degenerative changes from C5-C7. Electronically Signed   By: Harmon Pier M.D.   On: 03/26/2015 16:00   Ct Cervical Spine Wo Contrast  03/26/2015  CLINICAL DATA:  54 year old male with acute syncope today and left-sided headache and cervical spine pain. EXAM: CT HEAD WITHOUT CONTRAST CT CERVICAL SPINE WITHOUT CONTRAST TECHNIQUE: Multidetector CT imaging of the head and cervical spine was performed following the standard protocol without intravenous contrast. Multiplanar CT image reconstructions of the cervical spine were also generated. COMPARISON:  None. FINDINGS: CT HEAD FINDINGS No intracranial abnormalities are identified, including mass lesion or mass effect, hydrocephalus, extra-axial fluid collection, midline shift, hemorrhage, or acute infarction. The visualized bony calvarium is unremarkable. CT CERVICAL SPINE FINDINGS Normal alignment is noted. There is no evidence of acute fracture, subluxation or prevertebral soft tissue swelling. Mild -moderate degenerative disc disease and spondylosis at C5-6 and C6-7 noted. No focal bony lesions are identified. No soft tissue abnormalities are identified. Paraseptal emphysema in the lung apices noted. IMPRESSION: Unremarkable noncontrast head CT. No static evidence of acute injury to the cervical spine. Mild-moderate degenerative changes from C5-C7. Electronically Signed   By: Harmon Pier M.D.   On: 03/26/2015 16:00   Dg Chest Portable 1 View  03/26/2015  CLINICAL DATA:  Multiple syncopal episodes today.  Fell.  Vomiting. EXAM: PORTABLE CHEST 1 VIEW COMPARISON:  None. FINDINGS:  Normal sized heart. Clear lungs. Diffuse peribronchial thickening. Old, healed right clavicle  fracture. IMPRESSION: No acute abnormality.  Mild to moderate chronic bronchitic changes. Electronically Signed   By: Beckie SaltsSteven  Reid M.D.   On: 03/26/2015 16:08    IMPRESSION: Principal Problem:   Syncope and collapse Active Problems:   PAF (paroxysmal atrial fibrillation) (HCC)   Elevated troponin   Family history of coronary artery disease in father   RECOMMENDATION: Will review with MD. Check orthostatic B/P, cycle Troponin, check D Dimer, TSH, and echo. He will need a monitor after discharge. CHADs2VASc=0- no need to anticoagulate. Add low dose Lopressor. Hydrate.   Time Spent Directly with Patient: 40 minutes  Corine ShelterLuke Kilroy, [place]  [phone] beeper 03/26/2015, 4:56 PM   Attending Note:   The patient was seen and examined.  Agree with assessment and plan as noted above.  Changes made to the above note as needed.  1. Syncope: No previous episodes.   Has orthostasis on rare occasion . Symptoms could be related to orthostatis - he has not eaten all day .   If his echo is normal, I think the most likely scenerio is that he had  orthostasis    Will get an echo. Will monitor overnight. He will need a 30 day  Event monitor when he is discharged.  Other possiblities include arrhythmia. He had a brief episode of PAF and SVT but these were not associated with any recurrent syncope.  2. Atrial fib:   Had a brief run of paroxysmal atrial fib.   He had no post conversion pause when he converted to sinus rhythm.   His CHADS2VASC score is 0 . He does not need any additional meds at this point . Will get the echo. If he does not have any further PAF, he will not need long term follow up  3. Headache:  Further evaluation per Int. Med.  3. Leukocytosis:   Further eval by Int. Med.      Vesta MixerPhilip J. Nahser, Montez HagemanJr., MD, [facility] CenterFACC 03/26/2015, 5:23 PM [address],  [address] Office [phone] Pager [phone]

## 2015-03-27 ENCOUNTER — Observation Stay (HOSPITAL_BASED_OUTPATIENT_CLINIC_OR_DEPARTMENT_OTHER): Payer: BLUE CROSS/BLUE SHIELD

## 2015-03-27 ENCOUNTER — Other Ambulatory Visit: Payer: Self-pay | Admitting: Physician Assistant

## 2015-03-27 DIAGNOSIS — R55 Syncope and collapse: Secondary | ICD-10-CM

## 2015-03-27 DIAGNOSIS — R079 Chest pain, unspecified: Secondary | ICD-10-CM

## 2015-03-27 DIAGNOSIS — R51 Headache: Secondary | ICD-10-CM | POA: Diagnosis not present

## 2015-03-27 DIAGNOSIS — F17290 Nicotine dependence, other tobacco product, uncomplicated: Secondary | ICD-10-CM | POA: Diagnosis not present

## 2015-03-27 DIAGNOSIS — I480 Paroxysmal atrial fibrillation: Secondary | ICD-10-CM | POA: Diagnosis not present

## 2015-03-27 LAB — TROPONIN I: Troponin I: 0.07 ng/mL — ABNORMAL HIGH (ref <0.031)

## 2015-03-27 NOTE — Progress Notes (Signed)
Patient was discharged to home accompanied by his  father as per patient request. Discharge instructions, medication regimen and education given. Patient verbalized understanding. All personal belongings given. Telemetry box and IV removed prior to discharge.

## 2015-03-27 NOTE — Discharge Summary (Addendum)
Physician Discharge Summary  Wesley Barron D Barron ZOX:[id number] DOB: June 21, 1960 DOA: 03/26/2015  PCP: Julian HySOUTH,STEPHEN ALAN, MD  Admit date: 03/26/2015 Discharge date: 03/27/2015  Time spent: < 30 minutes  Recommendations for Outpatient Follow-up:  1. Follow up with Dr. Elease HashimotoNahser for outpatient stress test    Discharge Diagnoses:  Principal Problem:   Syncope and collapse Active Problems:   PAF (paroxysmal atrial fibrillation) (HCC)   Family history of coronary artery disease in father   Elevated troponin   Syncope  Discharge Condition: stable  Diet recommendation: regular  Filed Weights   03/27/15 0442  Weight: 91.218 kg (201 lb 1.6 oz)   History of present illness:  Wesley Barron is a 54 y.o. male has no past medical history, presents to the emergency room with a chief complaint of a syncopal episode. Patient was in his shop working, he was a cigar, when he had a syncopal episode and passed out. He also endorses a headache with nausea without. He got up and then he had another syncopal episode and he passed out again and hit his nose. EMS was called, and had an episode of emesis when EMS got there. He was brought to the emergency room where he went briefly to A. fib with RVR and an SVT with a heart rate of 140, he performed a vagal maneuver per EDP and he returned to sinus rhythm. He denies any chest pain or palpitations. He reports intermittent episodes of palpitations throughout the years, had one episode in the last year alone. In the emergency room, his blood work is pertinent for a white count of 22K, mild troponin elevation of 0.09 and a d-dimer elevation. Cardiology and neurology were consulted, there was initial concern for subarachnoid hemorrhage and patient underwent CT scan of the brain without acute findings and also on LP. LP results are pending. TRH was asked for admission.   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 fib with RVR and SVT which resolved on their own. Cardiology was consulted and they followed patient will hospitalized. He was monitored on telemetry without any significant events overnight. He underwent a 2-D echo which showed normal systolic and diastolic function with an EF of 55-60%. He had mildly elevated troponin which is likely demand, flat, and per cardiology will have a stress test as an outpatient. In the emergency room, he underwent a CT scan of the head without acute findings, as well as an LP given leukocytosis and headache which was unremarkable. His d-dimer was elevated on admission, had a CT angiogram of his chest which was negative for pulmonary embolus. He was discharged home in stable condition with close outpatient follow-up with cardiology.  Procedures:  2D echo   Consultations:  Cardiology   Discharge Exam: Filed Vitals:   03/27/15 0001 03/27/15 0004 03/27/15 0442 03/27/15 1200  BP: 99/57 99/55 107/57 112/59  Pulse: 58 55 51 46  Temp:   98.6 F (37 C) 98.7 F (37.1 C)  TempSrc:   Oral Oral  Resp:   18 20  Height:      Weight:   91.218 kg (201 lb 1.6 oz)   SpO2:   97% 98%    General: NAD Cardiovascular: RRR Respiratory: CTA biL  Discharge Instructions Activity:  As tolerated   Get Medicines reviewed and adjusted: Please take all your medications with you for your next  visit with your Primary MD  Please request your Primary MD to go over all hospital tests and procedure/radiological results at the follow up, please ask your Primary MD to get all Hospital records sent to his/her office.  If you experience worsening of your admission symptoms, develop shortness of breath, life threatening emergency, suicidal or homicidal thoughts you must seek medical attention immediately by calling 911 or calling your MD immediately if symptoms less severe.  You must read complete instructions/literature  along with all the possible adverse reactions/side effects for all the Medicines you take and that have been prescribed to you. Take any new Medicines after you have completely understood and accpet all the possible adverse reactions/side effects.   Do not drive when taking Pain medications.   Do not take more than prescribed Pain, Sleep and Anxiety Medications  Special Instructions: If you have smoked or chewed Tobacco in the last 2 yrs please stop smoking, stop any regular Alcohol and or any Recreational drug use.  Wear Seat belts while driving.  Please note  You were cared for by a hospitalist during your hospital stay. Once you are discharged, your primary care physician will handle any further medical issues. Please note that NO REFILLS for any discharge medications will be authorized once you are discharged, as it is imperative that you return to your primary care physician (or establish a relationship with a primary care physician if you do not have one) for your aftercare needs so that they can reassess your need for medications and monitor your lab values.    Medication List    TAKE these medications        aspirin-acetaminophen-caffeine 250-250-65 MG tablet  Commonly known as:  EXCEDRIN MIGRAINE  Take 1 tablet by mouth every 6 (six) hours as needed for headache.     GOODY BODY PAIN 500-325 MG Pack  Generic drug:  Aspirin-Acetaminophen  Take 1 Package by mouth daily as needed (pain).           Follow-up Information    Follow up with Nahser, Deloris Ping, MD.   Specialty:  Cardiology   Why:  The office will call you to make an appoinment for a stress test and follow up. , If you do not hear from them, please contact them., You should be seen within 1-2 weeks.   Contact information:   [address] [place] [facility] [place] [address] [phone]       The results of significant diagnostics from this hospitalization (including imaging, microbiology, ancillary and  laboratory) are listed below for reference.    Significant Diagnostic Studies: Ct Head Wo Contrast  03/26/2015  CLINICAL DATA:  54 year old male with acute syncope today and left-sided headache and cervical spine pain. EXAM: CT HEAD WITHOUT CONTRAST CT CERVICAL SPINE WITHOUT CONTRAST TECHNIQUE: Multidetector CT imaging of the head and cervical spine was performed following the standard protocol without intravenous contrast. Multiplanar CT image reconstructions of the cervical spine were also generated. COMPARISON:  None. FINDINGS: CT HEAD FINDINGS No intracranial abnormalities are identified, including mass lesion or mass effect, hydrocephalus, extra-axial fluid collection, midline shift, hemorrhage, or acute infarction. The visualized bony calvarium is unremarkable. CT CERVICAL SPINE FINDINGS Normal alignment is noted. There is no evidence of acute fracture, subluxation or prevertebral soft tissue swelling. Mild -moderate degenerative disc disease and spondylosis at C5-6 and C6-7 noted. No focal bony lesions are identified. No soft tissue abnormalities are identified. Paraseptal emphysema in the lung apices noted. IMPRESSION: Unremarkable noncontrast  head CT. No static evidence of acute injury to the cervical spine. Mild-moderate degenerative changes from C5-C7. Electronically Signed   By: Harmon Pier M.D.   On: 03/26/2015 16:00   Ct Angio Chest Pe W/cm &/or Wo Cm  03/26/2015  CLINICAL DATA:  54 year old male with multiple syncopal episodes while working outside. EXAM: CT ANGIOGRAPHY CHEST WITH CONTRAST TECHNIQUE: Multidetector CT imaging of the chest was performed using the standard protocol during bolus administration of intravenous contrast. Multiplanar CT image reconstructions and MIPs were obtained to evaluate the vascular anatomy. CONTRAST:  80mL OMNIPAQUE IOHEXOL 350 MG/ML SOLN COMPARISON:  No priors. FINDINGS: Mediastinum/Lymph Nodes: There are no filling defects within the pulmonary arterial tree  to suggest underlying pulmonary embolism. Heart size is normal. There is no significant pericardial fluid, thickening or pericardial calcification. No pathologically enlarged mediastinal or hilar lymph nodes. Esophagus is unremarkable in appearance. No axillary lymphadenopathy. Lungs/Pleura: There is some dependent areas of ground-glass attenuation which are favored to largely reflect some mild dependent subsegmental atelectasis. Mild centrilobular and paraseptal emphysema. No acute consolidative airspace disease. No pleural effusions. No suspicious appearing pulmonary nodules or masses. Upper Abdomen: Unremarkable. Musculoskeletal/Soft Tissues: Multiple chronic appearing compression fractures at T6, T7 and T8-11, with approximately 40% loss of central vertebral body height at the level of T11. There are no aggressive appearing lytic or blastic lesions noted in the visualized portions of the skeleton. Review of the MIP images confirms the above findings. IMPRESSION: 1. No evidence of pulmonary embolism. 2. No acute findings in the thorax to account for the patient's symptoms. 3. Mild diffuse bronchial wall thickening with mild centrilobular and paraseptal emphysema; imaging findings suggestive of underlying COPD. 4. Additional incidental findings, as above. Electronically Signed   By: Trudie Reed M.D.   On: 03/26/2015 18:44   Ct Cervical Spine Wo Contrast  03/26/2015  CLINICAL DATA:  54 year old male with acute syncope today and left-sided headache and cervical spine pain. EXAM: CT HEAD WITHOUT CONTRAST CT CERVICAL SPINE WITHOUT CONTRAST TECHNIQUE: Multidetector CT imaging of the head and cervical spine was performed following the standard protocol without intravenous contrast. Multiplanar CT image reconstructions of the cervical spine were also generated. COMPARISON:  None. FINDINGS: CT HEAD FINDINGS No intracranial abnormalities are identified, including mass lesion or mass effect, hydrocephalus,  extra-axial fluid collection, midline shift, hemorrhage, or acute infarction. The visualized bony calvarium is unremarkable. CT CERVICAL SPINE FINDINGS Normal alignment is noted. There is no evidence of acute fracture, subluxation or prevertebral soft tissue swelling. Mild -moderate degenerative disc disease and spondylosis at C5-6 and C6-7 noted. No focal bony lesions are identified. No soft tissue abnormalities are identified. Paraseptal emphysema in the lung apices noted. IMPRESSION: Unremarkable noncontrast head CT. No static evidence of acute injury to the cervical spine. Mild-moderate degenerative changes from C5-C7. Electronically Signed   By: Harmon Pier M.D.   On: 03/26/2015 16:00   Dg Chest Portable 1 View  03/26/2015  CLINICAL DATA:  Multiple syncopal episodes today.  Fell.  Vomiting. EXAM: PORTABLE CHEST 1 VIEW COMPARISON:  None. FINDINGS: Normal sized heart. Clear lungs. Diffuse peribronchial thickening. Old, healed right clavicle fracture. IMPRESSION: No acute abnormality.  Mild to moderate chronic bronchitic changes. Electronically Signed   By: Beckie Salts M.D.   On: 03/26/2015 16:08    Microbiology: Recent Results (from the past 240 hour(s))  CSF culture with Stat gram stain     Status: None (Preliminary result)   Collection Time: 03/26/15  5:29 PM  Result Value  Ref Range Status   Specimen Description CSF  Final   Special Requests NONE  Final   Gram Stain   Final    WBC PRESENT, PREDOMINANTLY MONONUCLEAR NO ORGANISMS SEEN CYTOSPIN    Culture NO GROWTH < 12 HOURS  Final   Report Status PENDING  Incomplete     Labs: Basic Metabolic Panel:  Recent Labs Lab 03/26/15 1531 03/26/15 1544  NA 141 144  K 3.8 3.7  CL 106 104  CO2 26  --   GLUCOSE 143* 136*  BUN 8 11  CREATININE 0.93 0.80  CALCIUM 9.4  --    Liver Function Tests:  Recent Labs Lab 03/26/15 1531  AST 28  ALT 33  ALKPHOS 77  BILITOT 0.9  PROT 7.2  ALBUMIN 3.9   CBC:  Recent Labs Lab  03/26/15 1531 03/26/15 1544  WBC 22.8*  --   NEUTROABS 19.9*  --   HGB 14.5 15.6  HCT 42.4 46.0  MCV 95.5  --   PLT 285  --    Cardiac Enzymes:  Recent Labs Lab 03/26/15 1736 03/26/15 2304 03/27/15 0503  TROPONINI 0.17* 0.12* 0.07*      Signed:  Pamella Pert  [facility] 03/27/2015, 1:54 PM

## 2015-03-27 NOTE — Progress Notes (Signed)
  Echocardiogram 2D Echocardiogram has been performed.  Delcie RochENNINGTON, Cian Costanzo 03/27/2015, 11:24 AM

## 2015-03-27 NOTE — Progress Notes (Signed)
The patient's HR was falling briefly to 39 when he was asleep this morning.  His VS have been stable and his HR was in the 40s-50s last night.  Dr. Elvera LennoxGherghe was text paged and the issue was reported off to the day shift nurse.

## 2015-03-27 NOTE — Progress Notes (Signed)
SUBJECTIVE:  Still complaining of headache.     PHYSICAL EXAM Filed Vitals:   03/26/15 2358 03/27/15 0001 03/27/15 0004 03/27/15 0442  BP: 107/57  Pulse: 54 58 55 51  Temp: 97.6 F (36.4 C)   98.6 F (37 C)  TempSrc: Oral   Oral  Resp: 18   18  Height:      Weight:    201 lb 1.6 oz (91.218 kg)  SpO2: 97%   97%   General:  No distress Lungs:  Clear Heart:  RRR Abdomen:  Positive bowel sounds, no rebound no guarding Extremities:  No edema  LABS: Lab Results  Component Value Date   TROPONINI 0.07* 03/27/2015   Results for orders placed or performed during the hospital encounter of 03/26/15 (from the past 24 hour(s))  Ethanol     Status: None   Collection Time: 03/26/15  3:31 PM  Result Value Ref Range   Alcohol, Ethyl (B) <5 <5 mg/dL  Protime-INR     Status: None   Collection Time: 03/26/15  3:31 PM  Result Value Ref Range   Prothrombin Time 15.0 11.6 - 15.2 seconds   INR 1.17 0.00 - 1.49  APTT     Status: None   Collection Time: 03/26/15  3:31 PM  Result Value Ref Range   aPTT 26 24 - 37 seconds  CBC     Status: Abnormal   Collection Time: 03/26/15  3:31 PM  Result Value Ref Range   WBC 22.8 (H) 4.0 - 10.5 K/uL   RBC 4.44 4.22 - 5.81 MIL/uL   Hemoglobin 14.5 13.0 - 17.0 g/dL   HCT 16.1 09.6 - 04.5 %   MCV 95.5 78.0 - 100.0 fL   MCH 32.7 26.0 - 34.0 pg   MCHC 34.2 30.0 - 36.0 g/dL   RDW 40.9 81.1 - 91.4 %   Platelets 285 150 - 400 K/uL  Differential     Status: Abnormal   Collection Time: 03/26/15  3:31 PM  Result Value Ref Range   Neutrophils Relative % 88 %   Neutro Abs 19.9 (H) 1.7 - 7.7 K/uL   Lymphocytes Relative 7 %   Lymphs Abs 1.7 0.7 - 4.0 K/uL   Monocytes Relative 5 %   Monocytes Absolute 1.2 (H) 0.1 - 1.0 K/uL   Eosinophils Relative 0 %   Eosinophils Absolute 0.1 0.0 - 0.7 K/uL   Basophils Relative 0 %   Basophils Absolute 0.0 0.0 - 0.1 K/uL  Comprehensive metabolic panel     Status: Abnormal   Collection Time:  03/26/15  3:31 PM  Result Value Ref Range   Sodium 141 135 - 145 mmol/L   Potassium 3.8 3.5 - 5.1 mmol/L   Chloride 106 101 - 111 mmol/L   CO2 26 22 - 32 mmol/L   Glucose, Bld 143 (H) 65 - 99 mg/dL   BUN 8 6 - 20 mg/dL   Creatinine, Ser 7.82 0.61 - 1.24 mg/dL   Calcium 9.4 8.9 - 95.6 mg/dL   Total Protein 7.2 6.5 - 8.1 g/dL   Albumin 3.9 3.5 - 5.0 g/dL   AST 28 15 - 41 U/L   ALT 33 17 - 63 U/L   Alkaline Phosphatase 77 38 - 126 U/L   Total Bilirubin 0.9 0.3 - 1.2 mg/dL   GFR calc non Af Amer >60 >60 mL/min   GFR calc Af Amer >60 >60 mL/min   Anion gap 9 5 - 15  D-dimer, quantitative (not at [facility])     Status: Abnormal   Collection Time: 03/26/15  3:31 PM  Result Value Ref Range   D-Dimer, Quant 0.84 (H) 0.00 - 0.50 ug/mL-FEU  I-stat troponin, ED (not at [facility])     Status: Abnormal   Collection Time: 03/26/15  3:38 PM  Result Value Ref Range   Troponin i, poc 0.09 (HH) 0.00 - 0.08 ng/mL   Comment NOTIFIED PHYSICIAN    Comment 3          I-Stat Chem 8, ED  (not at [facility], [facility])     Status: Abnormal   Collection Time: 03/26/15  3:44 PM  Result Value Ref Range   Sodium 144 135 - 145 mmol/L   Potassium 3.7 3.5 - 5.1 mmol/L   Chloride 104 101 - 111 mmol/L   BUN 11 6 - 20 mg/dL   Creatinine, Ser 2.530.80 0.61 - 1.24 mg/dL   Glucose, Bld 664136 (H) 65 - 99 mg/dL   Calcium, Ion 4.031.16 4.741.12 - 1.23 mmol/L   TCO2 25 0 - 100 mmol/L   Hemoglobin 15.6 13.0 - 17.0 g/dL   HCT 25.946.0 56.339.0 - 87.552.0 %  Urine rapid drug screen (hosp performed)not at [facility]     Status: Abnormal   Collection Time: 03/26/15  4:10 PM  Result Value Ref Range   Opiates NONE DETECTED NONE DETECTED   Cocaine NONE DETECTED NONE DETECTED   Benzodiazepines NONE DETECTED NONE DETECTED   Amphetamines NONE DETECTED NONE DETECTED   Tetrahydrocannabinol POSITIVE (A) NONE DETECTED   Barbiturates NONE DETECTED NONE DETECTED  Urinalysis, Routine w reflex microscopic (not at [facility])     Status: Abnormal   Collection Time: 03/26/15  4:10  PM  Result Value Ref Range   Color, Urine YELLOW YELLOW   APPearance CLOUDY (A) CLEAR   Specific Gravity, Urine 1.016 1.005 - 1.030   pH 6.0 5.0 - 8.0   Glucose, UA NEGATIVE NEGATIVE mg/dL   Hgb urine dipstick NEGATIVE NEGATIVE   Bilirubin Urine NEGATIVE NEGATIVE   Ketones, ur 15 (A) NEGATIVE mg/dL   Protein, ur 30 (A) NEGATIVE mg/dL   Nitrite NEGATIVE NEGATIVE   Leukocytes, UA NEGATIVE NEGATIVE  Urine microscopic-add on     Status: Abnormal   Collection Time: 03/26/15  4:10 PM  Result Value Ref Range   Squamous Epithelial / LPF 0-5 (A) NONE SEEN   WBC, UA 0-5 0 - 5 WBC/hpf   RBC / HPF NONE SEEN 0 - 5 RBC/hpf   Bacteria, UA RARE (A) NONE SEEN   Casts GRANULAR CAST (A) NEGATIVE  CSF cell count with differential collection tube #: 1     Status: Abnormal   Collection Time: 03/26/15  5:29 PM  Result Value Ref Range   Tube # 1    Color, CSF COLORLESS COLORLESS   Appearance, CSF CLEAR CLEAR   Supernatant NOT INDICATED    RBC Count, CSF 1 (H) 0 /cu mm   WBC, CSF 2 0 - 5 /cu mm   Segmented Neutrophils-CSF NONE SEEN 0 - 6 %   Lymphs, CSF FEW 40 - 80 %   Monocyte-Macrophage-Spinal Fluid RARE 15 - 45 %   Eosinophils, CSF NONE SEEN 0 - 1 %  CSF cell count with differential collection tube #: 4     Status: Abnormal   Collection Time: 03/26/15  5:29 PM  Result Value Ref Range   Tube # 4    Color, CSF COLORLESS COLORLESS   Appearance,  CSF CLEAR CLEAR   Supernatant NOT INDICATED    RBC Count, CSF 2 (H) 0 /cu mm   WBC, CSF 0 0 - 5 /cu mm   Segmented Neutrophils-CSF NONE SEEN 0 - 6 %   Lymphs, CSF FEW 40 - 80 %   Monocyte-Macrophage-Spinal Fluid RARE 15 - 45 %   Eosinophils, CSF NONE SEEN 0 - 1 %  CSF culture with Stat gram stain     Status: None (Preliminary result)   Collection Time: 03/26/15  5:29 PM  Result Value Ref Range   Specimen Description CSF    Special Requests NONE    Gram Stain      WBC PRESENT, PREDOMINANTLY MONONUCLEAR NO ORGANISMS SEEN CYTOSPIN    Culture NO  GROWTH < 12 HOURS    Report Status PENDING   Protein and glucose, CSF     Status: Abnormal   Collection Time: 03/26/15  5:34 PM  Result Value Ref Range   Glucose, CSF 80 (H) 40 - 70 mg/dL   Total  Protein, CSF 58 (H) 15 - 45 mg/dL  Troponin I     Status: Abnormal   Collection Time: 03/26/15  5:36 PM  Result Value Ref Range   Troponin I 0.17 (H) <0.031 ng/mL  TSH     Status: None   Collection Time: 03/26/15  5:36 PM  Result Value Ref Range   TSH 0.774 0.350 - 4.500 uIU/mL  Troponin I     Status: Abnormal   Collection Time: 03/26/15 11:04 PM  Result Value Ref Range   Troponin I 0.12 (H) <0.031 ng/mL  Troponin I     Status: Abnormal   Collection Time: 03/27/15  5:03 AM  Result Value Ref Range   Troponin I 0.07 (H) <0.031 ng/mL    Intake/Output Summary (Last 24 hours) at 03/27/15 1126 Last data filed at 03/27/15 0936  Gross per 24 hour  Intake   1900 ml  Output    900 ml  Net   1000 ml    EKG:    NSR, rate 46, no acute ST T wave changes.  03/27/2015  ASSESSMENT AND PLAN:  PAF:  No further episodes.  Mr. Wesley Barron has a CHA2DS2 - VASc score of 0.    Echo is done but not resulted yet.    SYNCOPE:   Work up per primary team.    ELEVATED TROPONIN:   Trended down.  I will ask our staff to schedule an out patient Lexiscan Myoview.  Wesley Barron [facility] 03/27/2015 11:26 AM

## 2015-03-27 NOTE — Discharge Instructions (Signed)
Follow with Wesley Barron,Wesley ALAN, MD in 1-2 months  Please get a complete blood count and chemistry panel checked by your Primary MD at your next visit, and again as instructed by your Primary MD. Please get your medications reviewed and adjusted by your Primary MD.  Please request your Primary MD to go over all Hospital Tests and Procedure/Radiological results at the follow up, please get all Hospital records sent to your Prim MD by signing hospital release before you go home.  If you had Pneumonia of Lung problems at the Hospital: Please get a 2 view Chest X Finlee done in 6-8 weeks after hospital discharge or sooner if instructed by your Primary MD.  If you have Congestive Heart Failure: Please call your Cardiologist or Primary MD anytime you have any of the following symptoms:  1) 3 pound weight gain in 24 hours or 5 pounds in 1 week  2) shortness of breath, with or without a dry hacking cough  3) swelling in the hands, feet or stomach  4) if you have to sleep on extra pillows at night in order to breathe  Follow cardiac low salt diet and 1.5 lit/day fluid restriction.  If you have diabetes Accuchecks 4 times/day, Once in AM empty stomach and then before each meal. Log in all results and show them to your primary doctor at your next visit. If any glucose reading is under 80 or above 300 call your primary MD immediately.  If you have Seizure/Convulsions/Epilepsy: Please do not drive, operate heavy machinery, participate in activities at heights or participate in high speed sports until you have seen by Primary MD or a Neurologist and advised to do so again.  If you had Gastrointestinal Bleeding: Please ask your Primary MD to check a complete blood count within one week of discharge or at your next visit. Your endoscopic/colonoscopic biopsies that are pending at the time of discharge, will also need to followed by your Primary MD.  Get Medicines reviewed and adjusted. Please take all your  medications with you for your next visit with your Primary MD  Please request your Primary MD to go over all hospital tests and procedure/radiological results at the follow up, please ask your Primary MD to get all Hospital records sent to his/her office.  If you experience worsening of your admission symptoms, develop shortness of breath, life threatening emergency, suicidal or homicidal thoughts you must seek medical attention immediately by calling 911 or calling your MD immediately  if symptoms less severe.  You must read complete instructions/literature along with all the possible adverse reactions/side effects for all the Medicines you take and that have been prescribed to you. Take any new Medicines after you have completely understood and accpet all the possible adverse reactions/side effects.   Do not drive or operate heavy machinery when taking Pain medications.   Do not take more than prescribed Pain, Sleep and Anxiety Medications  Special Instructions: If you have smoked or chewed Tobacco  in the last 2 yrs please stop smoking, stop any regular Alcohol  and or any Recreational drug use.  Wear Seat belts while driving.  Please note You were cared for by a hospitalist during your hospital stay. If you have any questions about your discharge medications or the care you received while you were in the hospital after you are discharged, you can call the unit and asked to speak with the hospitalist on call if the hospitalist that took care of you is not available. Once  you are discharged, your primary care physician will handle any further medical issues. Please note that NO REFILLS for any discharge medications will be authorized once you are discharged, as it is imperative that you return to your primary care physician (or establish a relationship with a primary care physician if you do not have one) for your aftercare needs so that they can reassess your need for medications and monitor your  lab values.  You can reach the hospitalist office at phone [phone] or fax [phone]   If you do not have a primary care physician, you can call [phone] for a physician referral.  Activity: As tolerated with Full fall precautions use walker/cane & assistance as needed  Diet: regular  Disposition Home

## 2015-03-30 LAB — CSF CULTURE: CULTURE: NO GROWTH

## 2015-03-30 LAB — CSF CULTURE W GRAM STAIN

## 2015-04-08 ENCOUNTER — Telehealth (HOSPITAL_COMMUNITY): Payer: Self-pay

## 2015-04-08 NOTE — Telephone Encounter (Signed)
Encounter complete. 

## 2015-04-12 ENCOUNTER — Ambulatory Visit (HOSPITAL_COMMUNITY): Payer: BLUE CROSS/BLUE SHIELD | Attending: Cardiovascular Disease

## 2015-04-12 DIAGNOSIS — R079 Chest pain, unspecified: Secondary | ICD-10-CM | POA: Diagnosis present

## 2015-04-12 DIAGNOSIS — R002 Palpitations: Secondary | ICD-10-CM | POA: Insufficient documentation

## 2015-04-12 DIAGNOSIS — Z8249 Family history of ischemic heart disease and other diseases of the circulatory system: Secondary | ICD-10-CM | POA: Diagnosis not present

## 2015-04-12 DIAGNOSIS — R55 Syncope and collapse: Secondary | ICD-10-CM | POA: Insufficient documentation

## 2015-04-12 LAB — MYOCARDIAL PERFUSION IMAGING
CHL CUP NUCLEAR SDS: 1
CHL CUP NUCLEAR SRS: 3
CHL CUP NUCLEAR SSS: 4
CHL CUP RESTING HR STRESS: 44 {beats}/min
CSEPPHR: 79 {beats}/min
LV dias vol: 178 mL
LV sys vol: 80 mL
RATE: 0.35
TID: 0.96

## 2015-04-12 MED ORDER — TECHNETIUM TC 99M SESTAMIBI GENERIC - CARDIOLITE
32.1000 | Freq: Once | INTRAVENOUS | Status: AC | PRN
  Administered 2015-04-12: 32.1 via INTRAVENOUS

## 2015-04-12 MED ORDER — REGADENOSON 0.4 MG/5ML IV SOLN
0.4000 mg | Freq: Once | INTRAVENOUS | Status: AC
  Administered 2015-04-12: 0.4 mg via INTRAVENOUS

## 2015-04-12 MED ORDER — TECHNETIUM TC 99M SESTAMIBI GENERIC - CARDIOLITE
10.5000 | Freq: Once | INTRAVENOUS | Status: AC | PRN
  Administered 2015-04-12: 11 via INTRAVENOUS

## 2015-04-13 ENCOUNTER — Telehealth: Payer: Self-pay | Admitting: Physician Assistant

## 2015-04-13 NOTE — Telephone Encounter (Signed)
Pt rtn call re stress test results

## 2015-04-26 ENCOUNTER — Encounter: Payer: Self-pay | Admitting: Cardiovascular Disease

## 2015-04-26 ENCOUNTER — Ambulatory Visit (INDEPENDENT_AMBULATORY_CARE_PROVIDER_SITE_OTHER): Payer: BLUE CROSS/BLUE SHIELD | Admitting: Cardiovascular Disease

## 2015-04-26 VITALS — BP 122/62 | HR 60 | Ht 74.0 in | Wt 207.6 lb

## 2015-04-26 DIAGNOSIS — I480 Paroxysmal atrial fibrillation: Secondary | ICD-10-CM

## 2015-04-26 DIAGNOSIS — R55 Syncope and collapse: Secondary | ICD-10-CM | POA: Diagnosis not present

## 2015-04-26 NOTE — Patient Instructions (Signed)
Medication Instructions:  Your physician recommends that you continue on your current medications as directed. Please refer to the Current Medication list given to you today.   Labwork: None Ordered   Testing/Procedures: None Ordered   Follow-Up: Your physician recommends that you schedule a follow-up appointment in: as needed with Dr. Nahser   If you need a refill on your cardiac medications before your next appointment, please call your pharmacy.   Thank you for choosing [facility]! Theresa Wedel, RN [phone]    
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
## 2017-01-11 IMAGING — CT CT HEAD W/O CM
3 of 5 series · 14 of 47 positions shown, 16 images · non-contrast
Comparison: None.

CLINICAL DATA: 54-year-old male with acute syncope today and
left-sided headache and cervical spine pain.

EXAM:
CT HEAD WITHOUT CONTRAST
CT CERVICAL SPINE WITHOUT CONTRAST
TECHNIQUE: Multidetector CT imaging of the head and cervical spine was
performed following the standard protocol without intravenous
contrast. Multiplanar CT image reconstructions of the cervical spine
were also generated.

[Series 302: soft tissue, idose (2) · axial · 0.36mm/px · z∈[+50,+262]mm · 8 of 122 slices shown, 10 images]
[im 8/122  brain]
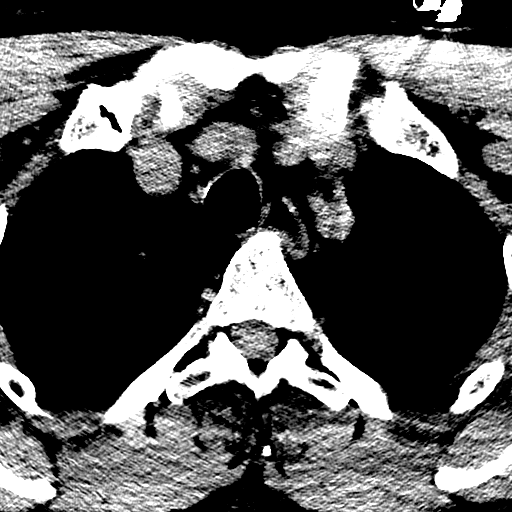
[im 8/122  bone]
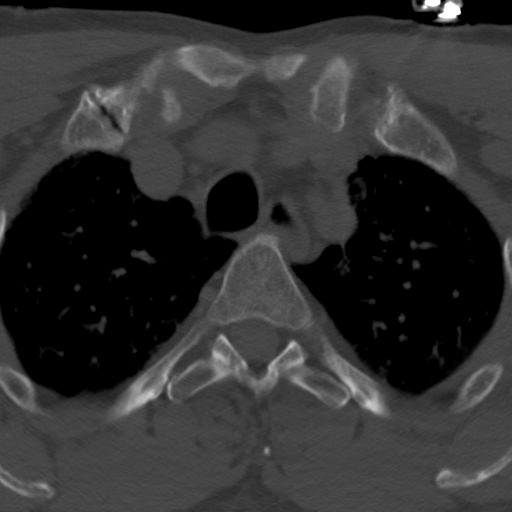
[im 23/122  brain]
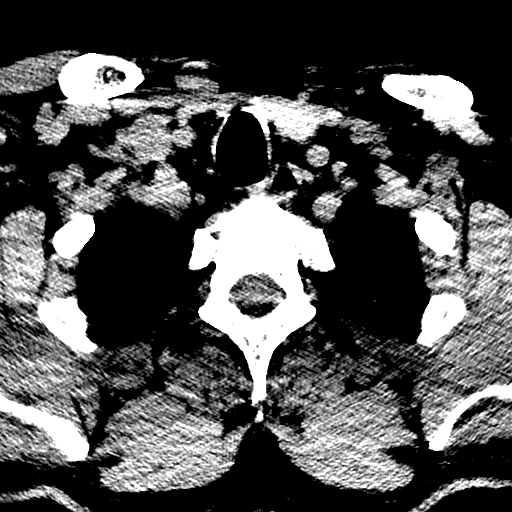
[im 38/122  brain]
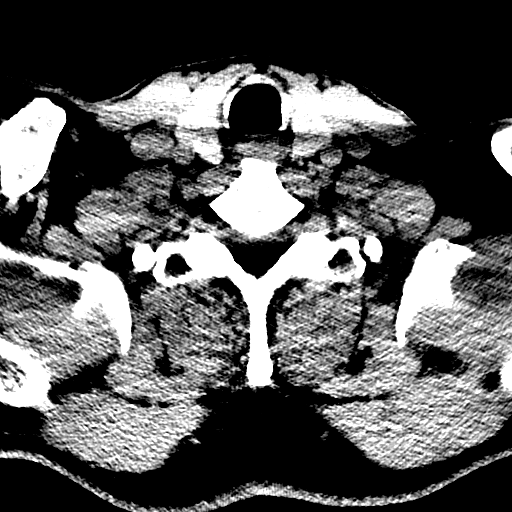
[im 53/122  brain]
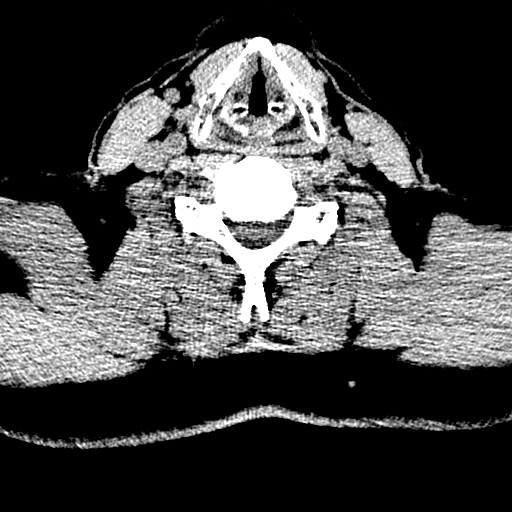
[im 69/122  brain]
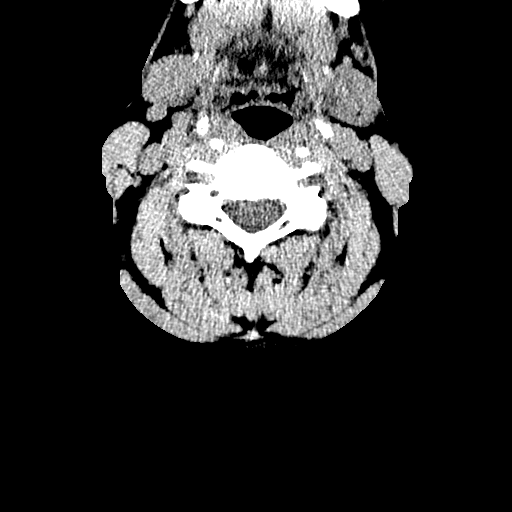
[im 69/122  bone]
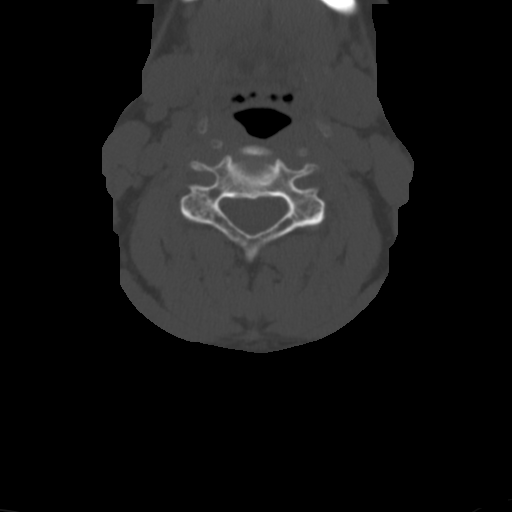
[im 84/122  brain]
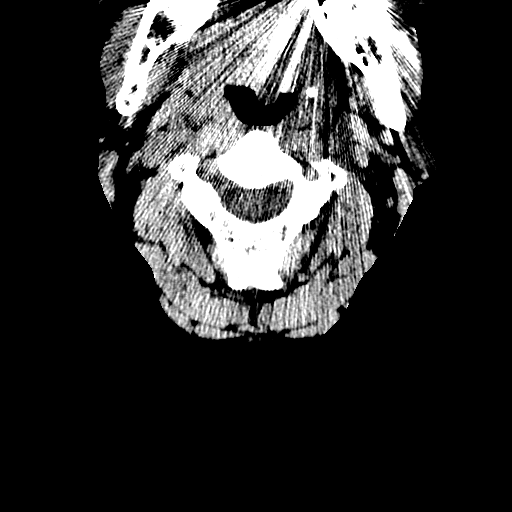
[im 99/122  brain]
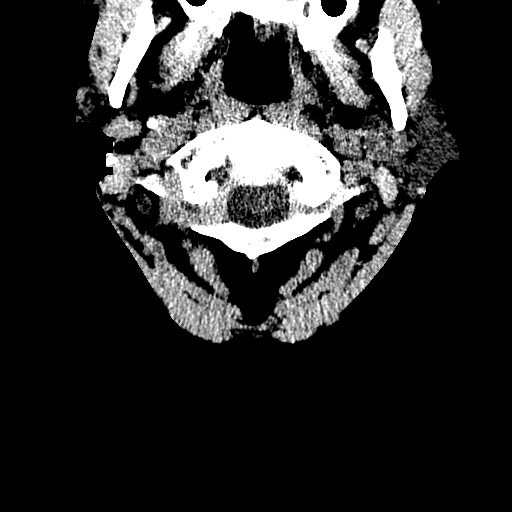
[im 114/122  brain]
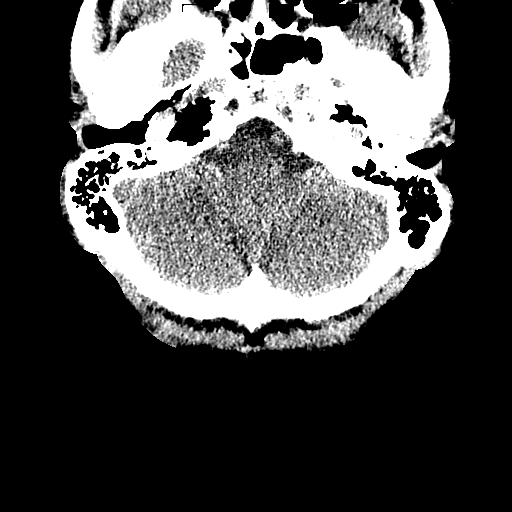

[Series 305: sagittal, idose (2) · sagittal · 0.34mm/px · 3 of 93 slices shown]
[im 31/93  brain]
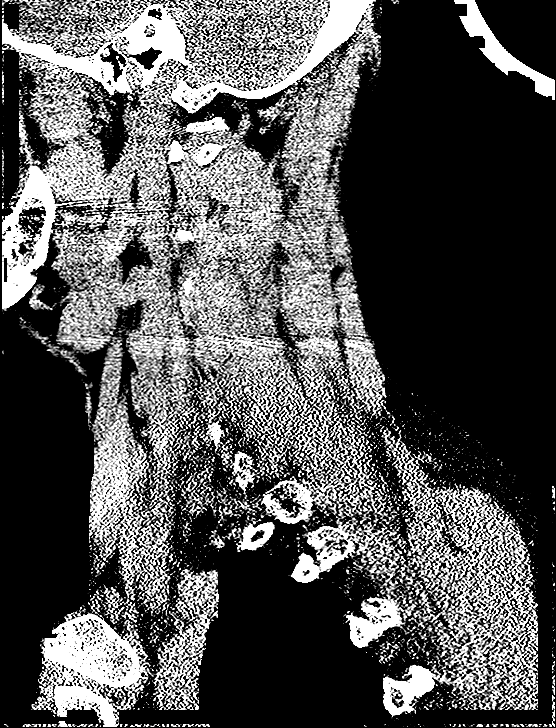
[im 47/93  brain]
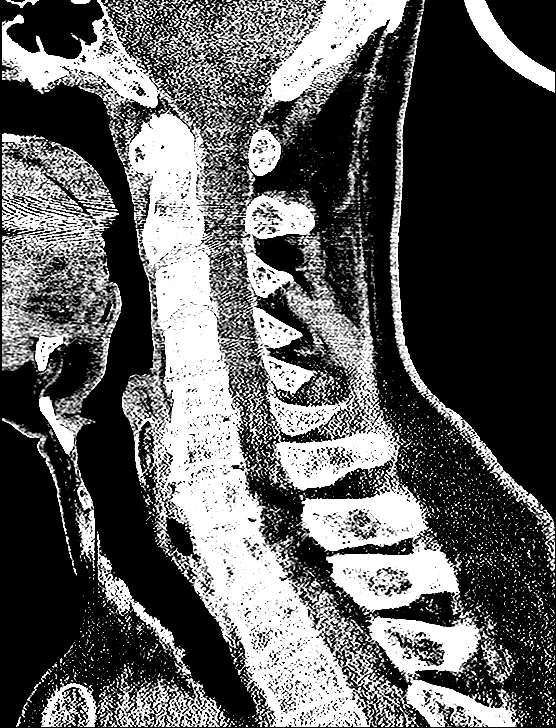
[im 62/93  brain]
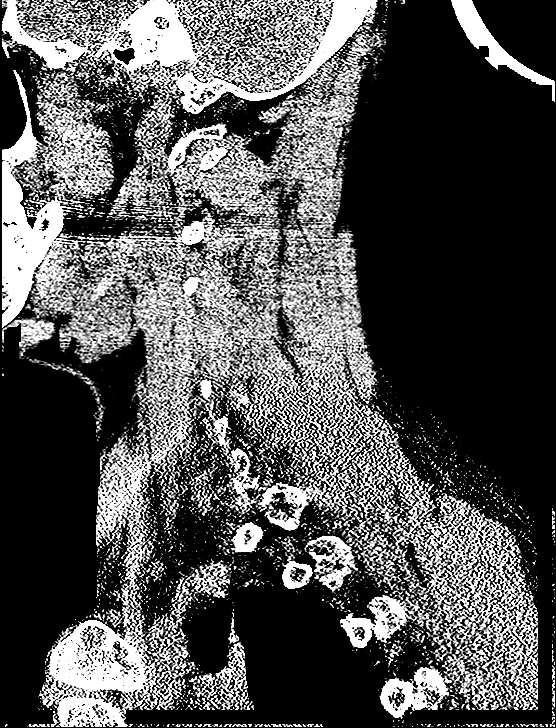

[Series 307: coronal, idose (2) · coronal · 0.34mm/px · 3 of 62 slices shown]
[im 21/62  brain]
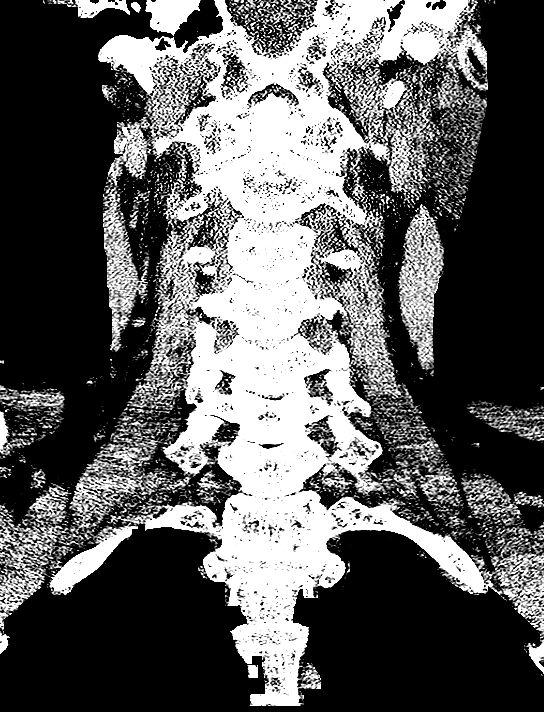
[im 28/62  brain]
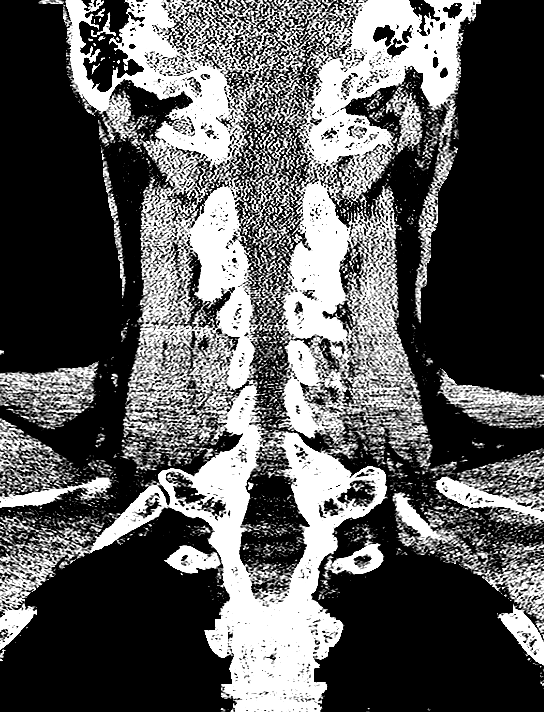
[im 34/62  brain]
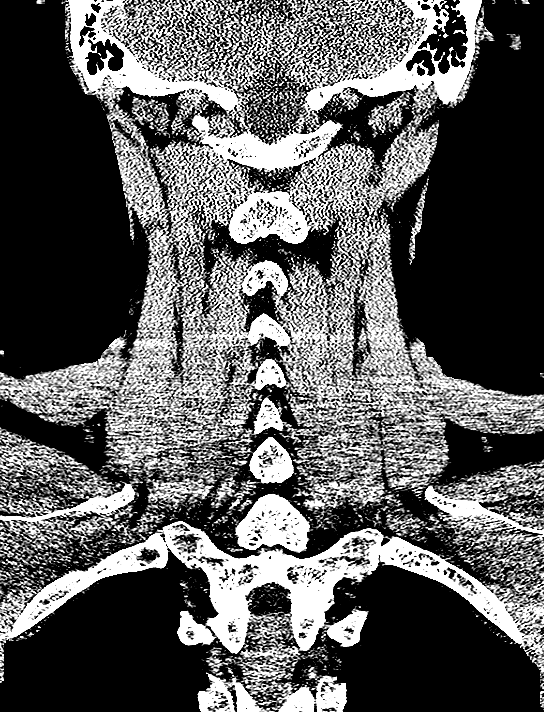

[14 of 47 positions shown; findings below may reference images not displayed]

FINDINGS: CT HEAD FINDINGS

No intracranial abnormalities are identified, including mass lesion
or mass effect, hydrocephalus, extra-axial fluid collection, midline
shift, hemorrhage, or acute infarction.

The visualized bony calvarium is unremarkable.

CT CERVICAL SPINE FINDINGS

Normal alignment is noted.

There is no evidence of acute fracture, subluxation or prevertebral
soft tissue swelling.

Mild -moderate degenerative disc disease and spondylosis at C5-6 and
C6-7 noted.

No focal bony lesions are identified.

No soft tissue abnormalities are identified.

Paraseptal emphysema in the lung apices noted.
IMPRESSION: Unremarkable noncontrast head CT.

No static evidence of acute injury to the cervical spine.
Mild-moderate degenerative changes from C5-C7.

## 2017-01-11 IMAGING — CR DG CHEST 1V PORT
1 series · 1 of 1 positions shown · non-contrast
Comparison: None.

CLINICAL DATA: Multiple syncopal episodes today.  Fell.  Vomiting.

EXAM:
PORTABLE CHEST 1 VIEW

[AP]
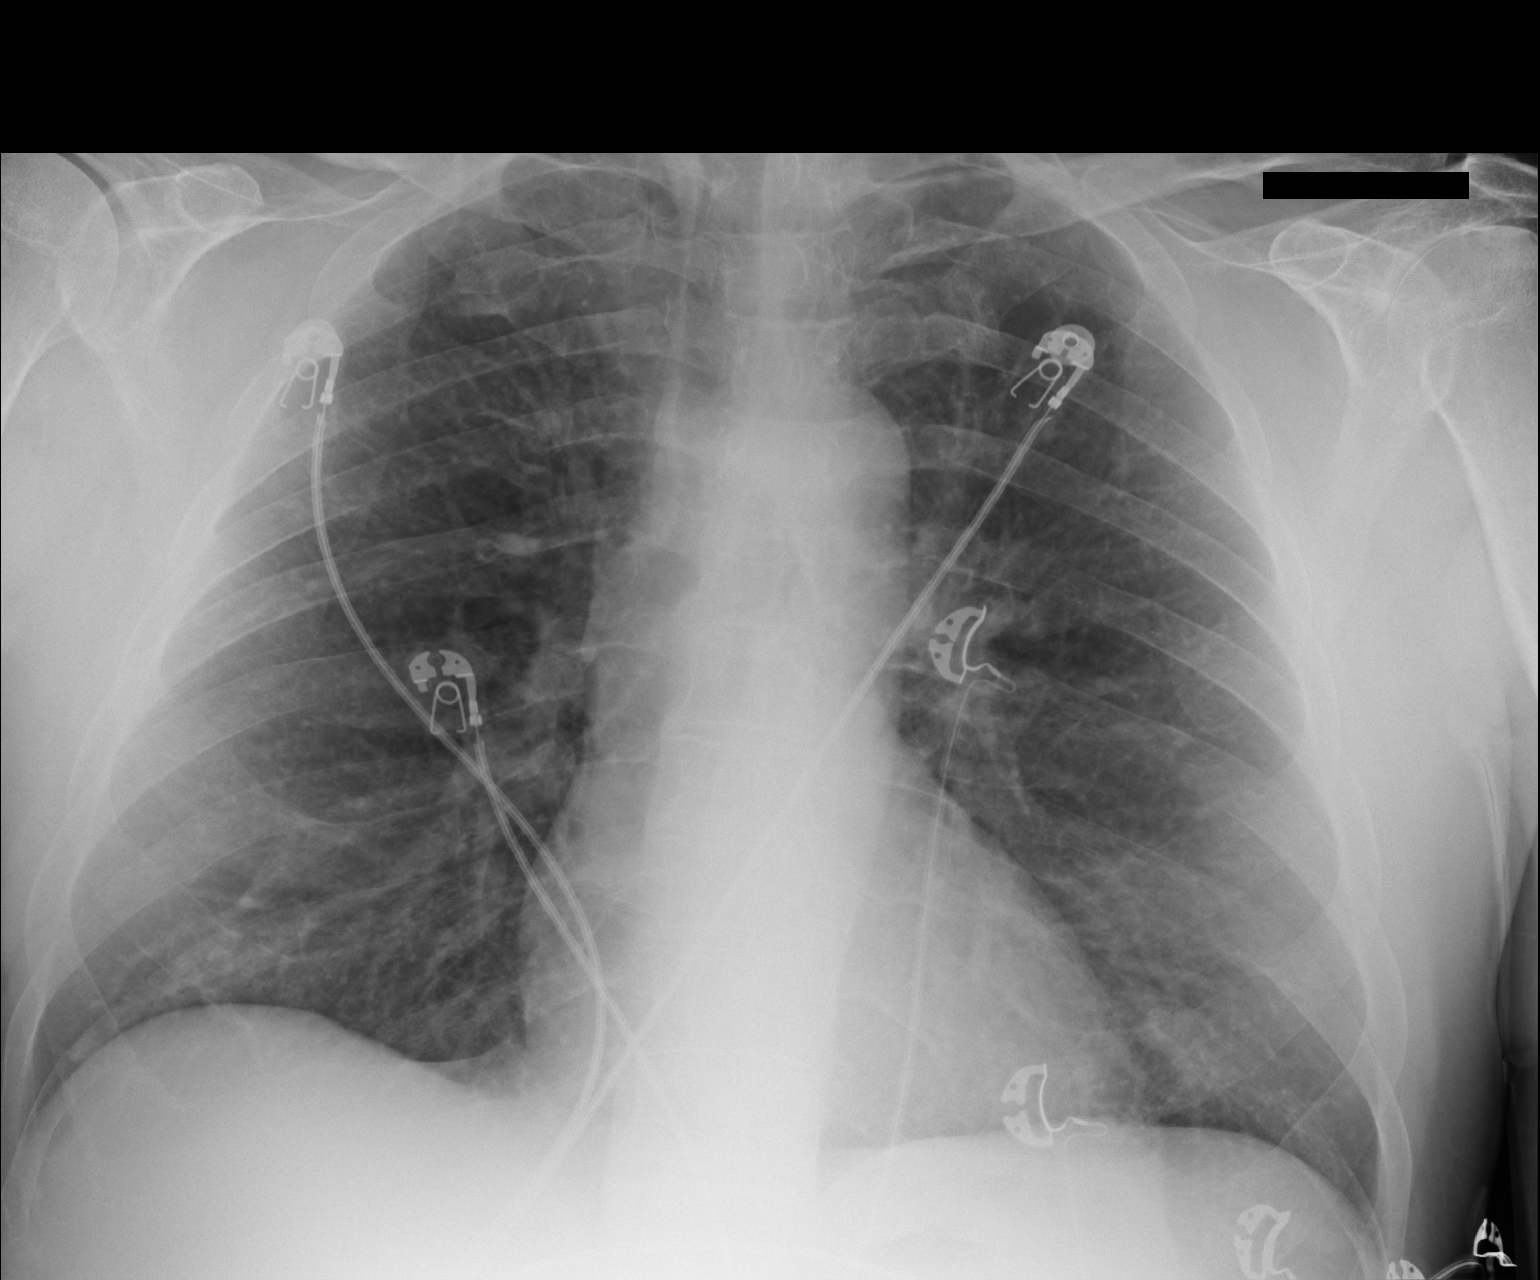

[1 of 1 positions shown; findings below may reference images not displayed]

FINDINGS: Normal sized heart. Clear lungs. Diffuse peribronchial thickening.
Old, healed right clavicle fracture.
IMPRESSION: No acute abnormality.  Mild to moderate chronic bronchitic changes.

## 2018-05-01 ENCOUNTER — Encounter (INDEPENDENT_AMBULATORY_CARE_PROVIDER_SITE_OTHER): Payer: BLUE CROSS/BLUE SHIELD | Admitting: Internal Medicine

## 2018-05-15 ENCOUNTER — Encounter (INDEPENDENT_AMBULATORY_CARE_PROVIDER_SITE_OTHER): Payer: Self-pay | Admitting: *Deleted

## 2018-05-15 ENCOUNTER — Encounter (INDEPENDENT_AMBULATORY_CARE_PROVIDER_SITE_OTHER): Payer: Self-pay | Admitting: Internal Medicine

## 2018-05-15 ENCOUNTER — Ambulatory Visit (INDEPENDENT_AMBULATORY_CARE_PROVIDER_SITE_OTHER): Payer: BLUE CROSS/BLUE SHIELD | Admitting: Internal Medicine

## 2018-05-15 VITALS — BP 134/74 | HR 60 | Temp 98.4°F | Ht 75.0 in | Wt 207.0 lb

## 2018-05-15 DIAGNOSIS — B182 Chronic viral hepatitis C: Secondary | ICD-10-CM | POA: Diagnosis not present

## 2018-05-15 DIAGNOSIS — B1710 Acute hepatitis C without hepatic coma: Secondary | ICD-10-CM
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
## 2018-05-18 LAB — PAIN MGMT, PROFILE 5 W/O MEDMATCH U
AMPHETAMINES: NEGATIVE ng/mL (ref <500)
BARBITURATES: NEGATIVE ng/mL (ref <300)
Benzodiazepines: NEGATIVE ng/mL (ref <100)
Cocaine Metabolite: NEGATIVE ng/mL (ref <150)
Creatinine: 124.6 mg/dL
Marijuana Metabolite: >500 ng/mL — ABNORMAL HIGH (ref <5)
Marijuana Metabolite: POSITIVE ng/mL — AB (ref <20)
Methadone Metabolite: NEGATIVE ng/mL (ref <100)
OXIDANT: NEGATIVE ug/mL (ref <200)
Opiates: NEGATIVE ng/mL (ref <100)
Oxycodone: NEGATIVE ng/mL (ref <100)
pH: 7.23 (ref 4.5–9.0)

## 2018-05-18 LAB — HEPATITIS C RNA QUANTITATIVE
HCV QUANT LOG: 6.98 {Log_IU}/mL — AB
HCV RNA, PCR, QN: 9480000 IU/mL — ABNORMAL HIGH

## 2018-05-18 LAB — PROTIME-INR
INR: 0.9
Prothrombin Time: 9.9 s (ref 9.0–11.5)

## 2018-05-18 LAB — AFP TUMOR MARKER: AFP TUMOR MARKER: 1.2 ng/mL (ref <6.1)

## 2018-05-18 LAB — HIV ANTIBODY (ROUTINE TESTING W REFLEX): HIV 1&2 Ab, 4th Generation: NONREACTIVE

## 2018-05-18 LAB — HEPATITIS PANEL, ACUTE
HEP B C IGM: NONREACTIVE
Hep A IgM: NONREACTIVE
Hepatitis B Surface Ag: NONREACTIVE
Hepatitis C Ab: REACTIVE — AB
SIGNAL TO CUT-OFF: 20 — ABNORMAL HIGH (ref <1.00)

## 2018-05-18 LAB — HEPATITIS C GENOTYPE

## 2018-05-21 ENCOUNTER — Telehealth (INDEPENDENT_AMBULATORY_CARE_PROVIDER_SITE_OTHER): Payer: Self-pay | Admitting: Internal Medicine

## 2018-05-21 ENCOUNTER — Ambulatory Visit (HOSPITAL_COMMUNITY)
Admission: RE | Admit: 2018-05-21 | Discharge: 2018-05-21 | Disposition: A | Discharge status: Home / Self Care | Payer: Medicare Other | Source: Ambulatory Visit | Attending: Internal Medicine | Admitting: Internal Medicine

## 2018-05-21 DIAGNOSIS — B1710 Acute hepatitis C without hepatic coma: Secondary | ICD-10-CM | POA: Diagnosis not present

## 2018-05-21 NOTE — Telephone Encounter (Signed)
PA completed for [place] [facility] . Once we have head from them we will contact the patient.

## 2018-05-21 NOTE — Telephone Encounter (Signed)
Mavyret x 8 weeks.  

## 2018-05-21 NOTE — Telephone Encounter (Signed)
Has started the Hepatitis A and B vaccine

## 2018-05-30 ENCOUNTER — Telehealth (INDEPENDENT_AMBULATORY_CARE_PROVIDER_SITE_OTHER): Payer: Self-pay | Admitting: *Deleted

## 2018-05-30 ENCOUNTER — Other Ambulatory Visit (INDEPENDENT_AMBULATORY_CARE_PROVIDER_SITE_OTHER): Payer: Self-pay | Admitting: Internal Medicine

## 2018-05-30 DIAGNOSIS — B182 Chronic viral hepatitis C: Secondary | ICD-10-CM

## 2018-05-30 NOTE — Telephone Encounter (Signed)
Patient presented to the office to pick up his Mavyret . We reviewed how he was to take the medication and possible side effects. He was advised if he had any questions or problems to call our office.  He plans to start the medication 05/31/2018. Per Delrae Rend he will need to have the following lab work in 8 weeks. CBC/D , Hepatic Profile, HCV RNA Quant. He will also need OV in 8 weeks.

## 2018-05-30 NOTE — Telephone Encounter (Signed)
err

## 2018-05-31 ENCOUNTER — Encounter (INDEPENDENT_AMBULATORY_CARE_PROVIDER_SITE_OTHER): Payer: Self-pay | Admitting: Internal Medicine

## 2018-05-31 NOTE — Progress Notes (Signed)
p 

## 2018-06-13 ENCOUNTER — Other Ambulatory Visit (INDEPENDENT_AMBULATORY_CARE_PROVIDER_SITE_OTHER): Payer: Self-pay | Admitting: *Deleted

## 2018-06-13 ENCOUNTER — Encounter (INDEPENDENT_AMBULATORY_CARE_PROVIDER_SITE_OTHER): Payer: Self-pay | Admitting: *Deleted

## 2018-06-13 DIAGNOSIS — B182 Chronic viral hepatitis C: Secondary | ICD-10-CM

## 2018-07-29 ENCOUNTER — Other Ambulatory Visit: Payer: Self-pay

## 2018-07-29 ENCOUNTER — Ambulatory Visit (INDEPENDENT_AMBULATORY_CARE_PROVIDER_SITE_OTHER): Payer: Medicare Other | Admitting: Internal Medicine

## 2018-07-29 ENCOUNTER — Encounter (INDEPENDENT_AMBULATORY_CARE_PROVIDER_SITE_OTHER): Payer: Self-pay | Admitting: Internal Medicine

## 2018-07-29 DIAGNOSIS — I480 Paroxysmal atrial fibrillation: Secondary | ICD-10-CM | POA: Diagnosis not present

## 2018-07-29 DIAGNOSIS — B182 Chronic viral hepatitis C: Secondary | ICD-10-CM

## 2018-07-29 NOTE — Progress Notes (Signed)
   Subjective:    Patient ID: Wesley Barron, male    DOB: Dec 16, 1960, 58 y.o.   MRN: [id number] PCP [facility]. Start time 1130 am End time: 10:40am total time. 10 minutes.  HPI Patient consent to the telephone OV. He is at home. I am in the office. Telephone OV due to the COVID-19 risk.  Unable to do video OV.  US abdomen with elast: F2-F3. 05/15/2018 Genotype 1A.  Office visit for Hepatitis C. Treated with Mayvret x 8 weeks. He finished treatment`+ last Thursda (08/24/2018).  He is doing good. His appetite is good. No weight loss. At first, he did have some headaches with the [place] for about 3 days. He is not "eating" as many [employer]. His BMs are moving good. He says everything is great. He is doing great. His hepatic and CBC are normal.    CBC    Component Value Date/Time   WBC 9.2 07/25/2018 0909   RBC 4.36 07/25/2018 0909   HGB 14.2 07/25/2018 0909   HCT 40.6 07/25/2018 0909   PLT 313 07/25/2018 0909   MCV 93.1 07/25/2018 0909   MCH 32.6 07/25/2018 0909   MCHC 35.0 07/25/2018 0909   RDW 12.5 07/25/2018 0909   LYMPHSABS 2,208 07/25/2018 0909   MONOABS 1.2 (H) 03/26/2015 1531   EOSABS 239 07/25/2018 0909   BASOSABS 37 07/25/2018 0909   Hepatic Function Latest Ref Rng & Units 07/25/2018 03/26/2015  Total Protein 6.1 - 8.1 g/dL 8.0 7.2  Albumin 3.5 - 5.0 g/dL - 3.9  AST 10 - 35 U/L 14 28  ALT 9 - 46 U/L 13 33  Alk Phosphatase 38 - 126 U/L - 77  Total Bilirubin 0.2 - 1.2 mg/dL 0.9 0.9  Bilirubin, Direct 0.0 - 0.2 mg/dL 0.2 -     Review of Systems     Past Medical History:  Diagnosis Date  . Atherosclerosis     Past Surgical History:  Procedure Laterality Date  . ANKLE FUSION Right    titanium Plate  . rod in rt lower leg and then removed    . salivary gland removed rt neck      Allergies  Allergen Reactions  . Codeine Other (See Comments)    headache    Current Outpatient Medications on File Prior to Visit  Medication Sig Dispense Refill  .  Aspirin-Acetaminophen (GOODY BODY PAIN) 500-325 MG PACK Take 1 Package by mouth daily as needed (pain).     No current facility-administered medications on file prior to visit.      Objective:   Physical Exam  deferred        Assessment & Plan:  Hepatitis C. He is doing well. Labs look good.  Hep C quaint is still pending. Will give him results once  I get them. OV in 1 year.

## 2018-07-31 ENCOUNTER — Other Ambulatory Visit (INDEPENDENT_AMBULATORY_CARE_PROVIDER_SITE_OTHER): Payer: Self-pay | Admitting: *Deleted

## 2018-07-31 DIAGNOSIS — B1920 Unspecified viral hepatitis C without hepatic coma: Secondary | ICD-10-CM

## 2018-07-31 LAB — HEPATIC FUNCTION PANEL
AG Ratio: 1.3 (calc) (ref 1.0–2.5)
ALT: 13 U/L (ref 9–46)
AST: 14 U/L (ref 10–35)
Albumin: 4.5 g/dL (ref 3.6–5.1)
Alkaline phosphatase (APISO): 81 U/L (ref 35–144)
BILIRUBIN DIRECT: 0.2 mg/dL (ref 0.0–0.2)
Globulin: 3.5 g/dL (calc) (ref 1.9–3.7)
Indirect Bilirubin: 0.7 mg/dL (calc) (ref 0.2–1.2)
Total Bilirubin: 0.9 mg/dL (ref 0.2–1.2)
Total Protein: 8 g/dL (ref 6.1–8.1)

## 2018-07-31 LAB — CBC WITH DIFFERENTIAL/PLATELET
Absolute Monocytes: 708 cells/uL (ref 200–950)
Basophils Absolute: 37 cells/uL (ref 0–200)
Basophils Relative: 0.4 %
Eosinophils Absolute: 239 cells/uL (ref 15–500)
Eosinophils Relative: 2.6 %
HCT: 40.6 % (ref 38.5–50.0)
Hemoglobin: 14.2 g/dL (ref 13.2–17.1)
Lymphs Abs: 2208 cells/uL (ref 850–3900)
MCH: 32.6 pg (ref 27.0–33.0)
MCHC: 35 g/dL (ref 32.0–36.0)
MCV: 93.1 fL (ref 80.0–100.0)
MPV: 11.3 fL (ref 7.5–12.5)
Monocytes Relative: 7.7 %
NEUTROS PCT: 65.3 %
Neutro Abs: 6008 cells/uL (ref 1500–7800)
Platelets: 313 10*3/uL (ref 140–400)
RBC: 4.36 10*6/uL (ref 4.20–5.80)
RDW: 12.5 % (ref 11.0–15.0)
Total Lymphocyte: 24 %
WBC: 9.2 10*3/uL (ref 3.8–10.8)

## 2018-07-31 LAB — HEPATITIS C RNA QUANTITATIVE
HCV Quantitative Log: <1.18 Log IU/mL
HCV RNA, PCR, QN: <15 IU/mL

## 2018-08-29 ENCOUNTER — Ambulatory Visit (INDEPENDENT_AMBULATORY_CARE_PROVIDER_SITE_OTHER): Payer: Medicare Other | Admitting: Internal Medicine

## 2018-10-21 ENCOUNTER — Other Ambulatory Visit (INDEPENDENT_AMBULATORY_CARE_PROVIDER_SITE_OTHER): Payer: Self-pay | Admitting: *Deleted

## 2018-10-21 DIAGNOSIS — B1920 Unspecified viral hepatitis C without hepatic coma: Secondary | ICD-10-CM

## 2018-11-05 LAB — HEPATIC FUNCTION PANEL
AG Ratio: 1.3 (calc) (ref 1.0–2.5)
ALT: 15 U/L (ref 9–46)
AST: 12 U/L (ref 10–35)
Albumin: 4.1 g/dL (ref 3.6–5.1)
Alkaline phosphatase (APISO): 67 U/L (ref 35–144)
Bilirubin, Direct: 0.1 mg/dL (ref 0.0–0.2)
Globulin: 3.1 g/dL (calc) (ref 1.9–3.7)
Indirect Bilirubin: 0.2 mg/dL (calc) (ref 0.2–1.2)
Total Bilirubin: 0.3 mg/dL (ref 0.2–1.2)
Total Protein: 7.2 g/dL (ref 6.1–8.1)

## 2018-11-05 LAB — HEPATITIS C RNA QUANTITATIVE
HCV Quantitative Log: <1.18 Log IU/mL
HCV RNA, PCR, QN: <15 IU/mL

## 2018-12-23 DIAGNOSIS — M546 Pain in thoracic spine: Secondary | ICD-10-CM | POA: Diagnosis not present

## 2018-12-23 DIAGNOSIS — Z0000 Encounter for general adult medical examination without abnormal findings: Secondary | ICD-10-CM | POA: Diagnosis not present

## 2018-12-23 DIAGNOSIS — M816 Localized osteoporosis [Lequesne]: Secondary | ICD-10-CM | POA: Diagnosis not present

## 2018-12-24 DIAGNOSIS — M546 Pain in thoracic spine: Secondary | ICD-10-CM | POA: Diagnosis not present

## 2018-12-24 DIAGNOSIS — M47814 Spondylosis without myelopathy or radiculopathy, thoracic region: Secondary | ICD-10-CM | POA: Diagnosis not present

## 2019-02-13 DIAGNOSIS — I10 Essential (primary) hypertension: Secondary | ICD-10-CM | POA: Diagnosis not present

## 2019-02-13 DIAGNOSIS — R55 Syncope and collapse: Secondary | ICD-10-CM | POA: Diagnosis not present

## 2019-02-17 DIAGNOSIS — R55 Syncope and collapse: Secondary | ICD-10-CM | POA: Diagnosis not present

## 2022-04-06 ENCOUNTER — Other Ambulatory Visit (HOSPITAL_COMMUNITY): Payer: Self-pay | Admitting: Internal Medicine

## 2022-04-06 DIAGNOSIS — N50812 Left testicular pain: Secondary | ICD-10-CM

## 2022-04-07 ENCOUNTER — Other Ambulatory Visit (HOSPITAL_COMMUNITY): Payer: Self-pay | Admitting: Internal Medicine

## 2022-04-07 DIAGNOSIS — R1900 Intra-abdominal and pelvic swelling, mass and lump, unspecified site: Secondary | ICD-10-CM

## 2022-04-07 DIAGNOSIS — N50812 Left testicular pain: Secondary | ICD-10-CM

## 2022-04-20 ENCOUNTER — Ambulatory Visit (HOSPITAL_COMMUNITY)
Admission: RE | Admit: 2022-04-20 | Discharge: 2022-04-20 | Disposition: A | Discharge status: Home / Self Care | Payer: Medicare HMO | Source: Ambulatory Visit | Attending: Internal Medicine | Admitting: Internal Medicine

## 2022-04-20 DIAGNOSIS — R1900 Intra-abdominal and pelvic swelling, mass and lump, unspecified site: Secondary | ICD-10-CM

## 2022-04-20 DIAGNOSIS — N50812 Left testicular pain: Secondary | ICD-10-CM
